# Patient Record
Sex: Female | Born: 1965 | Hispanic: Yes | Marital: Single | Smoking: Never smoker
Health system: Southern US, Community
[De-identification: ages and names within clinical notes are randomized; demographics above are authoritative.]

## PROBLEM LIST (undated history)

## (undated) DIAGNOSIS — F329 Major depressive disorder, single episode, unspecified: Secondary | ICD-10-CM

## (undated) DIAGNOSIS — D649 Anemia, unspecified: Secondary | ICD-10-CM

## (undated) DIAGNOSIS — K759 Inflammatory liver disease, unspecified: Secondary | ICD-10-CM

## (undated) DIAGNOSIS — D219 Benign neoplasm of connective and other soft tissue, unspecified: Secondary | ICD-10-CM

## (undated) DIAGNOSIS — F419 Anxiety disorder, unspecified: Secondary | ICD-10-CM

## (undated) DIAGNOSIS — M79643 Pain in unspecified hand: Secondary | ICD-10-CM

## (undated) DIAGNOSIS — N921 Excessive and frequent menstruation with irregular cycle: Secondary | ICD-10-CM

## (undated) DIAGNOSIS — E7800 Pure hypercholesterolemia, unspecified: Secondary | ICD-10-CM

## (undated) DIAGNOSIS — K219 Gastro-esophageal reflux disease without esophagitis: Secondary | ICD-10-CM

## (undated) DIAGNOSIS — M791 Myalgia, unspecified site: Secondary | ICD-10-CM

## (undated) DIAGNOSIS — F32A Depression, unspecified: Secondary | ICD-10-CM

## (undated) HISTORY — PX: ABDOMINAL HYSTERECTOMY: SHX81

## (undated) HISTORY — PX: CHOLECYSTECTOMY: SHX55

## (undated) HISTORY — DX: Excessive and frequent menstruation with irregular cycle: N92.1

## (undated) HISTORY — PX: DILATION AND CURETTAGE OF UTERUS: SHX78

## (undated) HISTORY — DX: Benign neoplasm of connective and other soft tissue, unspecified: D21.9

## (undated) HISTORY — PX: OTHER SURGICAL HISTORY: SHX169

---

## 2001-04-25 ENCOUNTER — Emergency Department (HOSPITAL_COMMUNITY): Admission: EM | Admit: 2001-04-25 | Discharge: 2001-04-25 | Payer: Self-pay | Admitting: Emergency Medicine

## 2005-10-23 ENCOUNTER — Ambulatory Visit: Payer: Self-pay | Admitting: Nurse Practitioner

## 2005-11-06 ENCOUNTER — Other Ambulatory Visit: Admission: RE | Admit: 2005-11-06 | Discharge: 2005-11-06 | Payer: Self-pay | Admitting: Family Medicine

## 2005-11-06 ENCOUNTER — Ambulatory Visit: Payer: Self-pay | Admitting: Nurse Practitioner

## 2005-11-06 ENCOUNTER — Encounter (INDEPENDENT_AMBULATORY_CARE_PROVIDER_SITE_OTHER): Payer: Self-pay | Admitting: *Deleted

## 2005-11-11 ENCOUNTER — Ambulatory Visit (HOSPITAL_COMMUNITY): Admission: RE | Admit: 2005-11-11 | Discharge: 2005-11-11 | Payer: Self-pay | Admitting: Family Medicine

## 2005-11-20 ENCOUNTER — Encounter: Admission: RE | Admit: 2005-11-20 | Discharge: 2005-11-20 | Payer: Self-pay | Admitting: Family Medicine

## 2006-01-14 ENCOUNTER — Ambulatory Visit: Payer: Self-pay | Admitting: Nurse Practitioner

## 2010-02-03 ENCOUNTER — Emergency Department (HOSPITAL_COMMUNITY): Admission: EM | Admit: 2010-02-03 | Discharge: 2010-02-03 | Payer: Self-pay | Admitting: Emergency Medicine

## 2010-07-03 LAB — URINALYSIS, ROUTINE W REFLEX MICROSCOPIC
Bilirubin Urine: NEGATIVE
Glucose, UA: NEGATIVE mg/dL
Leukocytes, UA: NEGATIVE
Nitrite: NEGATIVE
Specific Gravity, Urine: 1.018 (ref 1.005–1.030)
Urobilinogen, UA: 0.2 mg/dL (ref 0.0–1.0)

## 2010-07-03 LAB — POCT I-STAT, CHEM 8
BUN: 7 mg/dL (ref 6–23)
Creatinine, Ser: 0.7 mg/dL (ref 0.4–1.2)
Hemoglobin: 10.9 g/dL — ABNORMAL LOW (ref 12.0–15.0)
Potassium: 3.9 mEq/L (ref 3.5–5.1)
Sodium: 137 mEq/L (ref 135–145)

## 2010-07-03 LAB — WET PREP, GENITAL: Trich, Wet Prep: NONE SEEN

## 2010-07-03 LAB — GC/CHLAMYDIA PROBE AMP, GENITAL: Chlamydia, DNA Probe: NEGATIVE

## 2010-07-31 ENCOUNTER — Inpatient Hospital Stay (HOSPITAL_COMMUNITY)
Admission: AD | Admit: 2010-07-31 | Discharge: 2010-07-31 | Disposition: A | Discharge status: Home / Self Care | Payer: Self-pay | Source: Ambulatory Visit | Attending: Obstetrics and Gynecology | Admitting: Obstetrics and Gynecology

## 2010-07-31 DIAGNOSIS — D649 Anemia, unspecified: Secondary | ICD-10-CM

## 2010-07-31 LAB — CBC
HCT: 30.7 % — ABNORMAL LOW (ref 36.0–46.0)
Hemoglobin: 8.6 g/dL — ABNORMAL LOW (ref 12.0–15.0)
MCHC: 28 g/dL — ABNORMAL LOW (ref 30.0–36.0)
MCV: 67.9 fL — ABNORMAL LOW (ref 78.0–100.0)
Platelets: 470 10*3/uL — ABNORMAL HIGH (ref 150–400)
RBC: 4.52 MIL/uL (ref 3.87–5.11)
RDW: 21 % — ABNORMAL HIGH (ref 11.5–15.5)

## 2010-07-31 LAB — COMPREHENSIVE METABOLIC PANEL
ALT: 33 U/L (ref 0–35)
Albumin: 3.8 g/dL (ref 3.5–5.2)
Alkaline Phosphatase: 59 U/L (ref 39–117)
Creatinine, Ser: 0.55 mg/dL (ref 0.4–1.2)
GFR calc non Af Amer: >60 mL/min (ref >60)
Sodium: 134 mEq/L — ABNORMAL LOW (ref 135–145)
Total Protein: 7.6 g/dL (ref 6.0–8.3)

## 2010-07-31 LAB — URINALYSIS, ROUTINE W REFLEX MICROSCOPIC
Bilirubin Urine: NEGATIVE
Ketones, ur: NEGATIVE mg/dL
Nitrite: NEGATIVE
Protein, ur: NEGATIVE mg/dL
Specific Gravity, Urine: 1.025 (ref 1.005–1.030)
Urobilinogen, UA: 0.2 mg/dL (ref 0.0–1.0)

## 2010-08-02 LAB — URINE CULTURE

## 2010-08-30 ENCOUNTER — Other Ambulatory Visit: Payer: Self-pay | Admitting: Family Medicine

## 2010-08-30 ENCOUNTER — Encounter (INDEPENDENT_AMBULATORY_CARE_PROVIDER_SITE_OTHER): Payer: Self-pay | Admitting: Family Medicine

## 2010-08-30 ENCOUNTER — Other Ambulatory Visit (HOSPITAL_COMMUNITY)
Admission: RE | Admit: 2010-08-30 | Discharge: 2010-08-30 | Disposition: A | Discharge status: Home / Self Care | Payer: Self-pay | Source: Ambulatory Visit | Attending: Family Medicine | Admitting: Family Medicine

## 2010-08-30 DIAGNOSIS — N920 Excessive and frequent menstruation with regular cycle: Secondary | ICD-10-CM

## 2010-08-30 DIAGNOSIS — N949 Unspecified condition associated with female genital organs and menstrual cycle: Secondary | ICD-10-CM | POA: Insufficient documentation

## 2010-08-30 DIAGNOSIS — N841 Polyp of cervix uteri: Secondary | ICD-10-CM | POA: Insufficient documentation

## 2010-08-30 DIAGNOSIS — N72 Inflammatory disease of cervix uteri: Secondary | ICD-10-CM | POA: Insufficient documentation

## 2010-08-30 DIAGNOSIS — N938 Other specified abnormal uterine and vaginal bleeding: Secondary | ICD-10-CM | POA: Insufficient documentation

## 2010-08-31 NOTE — Group Therapy Note (Signed)
NAMEETHLEEN, LORMAND        ACCOUNT NO.:  [id number]  MEDICAL RECORD NO.:  [id number]           PATIENT TYPE:  A  LOCATION:  [facility]                   FACILITY:  [facility]  PHYSICIAN:  Tinnie Gens, MD        DATE OF BIRTH:  1966/03/10  DATE OF SERVICE:  08/30/2010                                 CLINIC NOTE  CHIEF COMPLAINT:  Heavy vaginal bleeding.  HISTORY OF PRESENT ILLNESS:  The patient is a 45 year old gravida 3, para 1-0-2-1 who comes in for heavy vaginal bleeding for the last one and half years.  She was previously seen in a general medical clinic and in the ED twice for heavy vaginal bleeding.  She has been somewhat anemic with a hemoglobin of 8.8 without tremendous symptoms.  However, she is put on Provera which she takes when she is not having a cycle and stops when she is having a cycle.  PAST MEDICAL HISTORY:  Significant for anemia.  PAST SURGICAL HISTORY:  Cholecystectomy and D and C x2.  MEDICATIONS: 1. Provera 5 mg daily. 2. Iron daily.  ALLERGIES:  None known.  OBSTETRICAL HISTORY:  Her G1 is her first pregnancy resulted in a vaginal delivery of a term female and then followed by 2 miscarriages.  GYNECOLOGIC HISTORY:  No history of abnormal Pap.  She is not really interested in future fertility.  FAMILY HISTORY:  No breast, colon, or ovarian cancer noted.  Mother has benign uterine tumor.  SOCIAL HISTORY:  No tobacco, alcohol or drug use.  Her review of systems are reviewed.  She denies fevers, chills, nausea, vomiting, diarrhea, constipation, abdominal pain, chest pain, shortness of breath, headache, swelling, vision changes.  On exam today, her vitals are as noted in the chart.  Her blood pressure is 120/84.  She is 59 inches tall, 140 pounds.  GU, normal external female genitalia.  BUS is normal.  Vagina is pink and rugated.  Cervix is parous with an endocervical polyp noted.  PROCEDURE:  The cervix is prepped with Betadine, was grasped  anteriorly with single-tooth tenaculum, sound with a Pipelle to approximately 10 cm.  Endometrial sampling is taken.  Focal endocervical polyp was removed with ring forceps and by twisting, removed bulk of the polyp, there was a little bit of stalk left and this was removed in a similar fashion.  There is a minimal amount of oozing after this procedure.  The uterus felt somewhat enlarged.  Adnexa were not well appreciated.  IMPRESSION:  Abnormal uterine bleeding with anemia.  PLAN: 1. Check TSH, CBC today. 2. Await endometrial biopsy and cervical polyp results. 3. We will scheduled for pelvic ultrasound. 4. Follow up in 2 weeks for results.          ______________________________ Tinnie Gens, MD    TP/MEDQ  D:  08/30/2010  T:  08/30/2010  Job:  [id number]

## 2010-09-03 ENCOUNTER — Ambulatory Visit (HOSPITAL_COMMUNITY)
Admission: RE | Admit: 2010-09-03 | Discharge: 2010-09-03 | Disposition: A | Discharge status: Home / Self Care | Payer: Self-pay | Source: Ambulatory Visit | Attending: Family Medicine | Admitting: Family Medicine

## 2010-09-03 DIAGNOSIS — N920 Excessive and frequent menstruation with regular cycle: Secondary | ICD-10-CM | POA: Insufficient documentation

## 2010-09-03 DIAGNOSIS — N831 Corpus luteum cyst of ovary, unspecified side: Secondary | ICD-10-CM | POA: Insufficient documentation

## 2010-09-03 DIAGNOSIS — N83209 Unspecified ovarian cyst, unspecified side: Secondary | ICD-10-CM | POA: Insufficient documentation

## 2010-09-03 DIAGNOSIS — D250 Submucous leiomyoma of uterus: Secondary | ICD-10-CM | POA: Insufficient documentation

## 2010-09-03 DIAGNOSIS — D251 Intramural leiomyoma of uterus: Secondary | ICD-10-CM | POA: Insufficient documentation

## 2010-09-23 ENCOUNTER — Ambulatory Visit (INDEPENDENT_AMBULATORY_CARE_PROVIDER_SITE_OTHER): Payer: Self-pay | Admitting: Obstetrics & Gynecology

## 2010-09-23 DIAGNOSIS — N920 Excessive and frequent menstruation with regular cycle: Secondary | ICD-10-CM

## 2010-09-24 NOTE — Group Therapy Note (Signed)
NAME:  MARVIS, BAKKEN        ACCOUNT NO.:  [id number]  MEDICAL RECORD NO.:  [id number]           PATIENT TYPE:  A  LOCATION:  [facility]                   FACILITY:  [facility]  PHYSICIAN:  Elsie Lincoln, MD      DATE OF BIRTH:  05/10/1965  DATE OF SERVICE:  09/23/2010                                 CLINIC NOTE  The patient is a 45 year old female who presents for followup of menorrhagia.  The patient had ultrasound that showed multiple fibroids to the submucosal component.  The extent of the submucosal component could not be determined without saline sonohysterogram.  The patient also had endometrial biopsy which was negative and she had a cervical polyp removed which is negative for malignancy.  The patient has been on Provera continuously only 5 mg a day.  This was given to her by emergency room.  The patient believes only once a month but unpredictably, however, hemoglobin is increased from 8.8-12.4.  Given she had a good response to medication, I think she should try combined oral contraceptives, the patient has no contraindications to starting. She is a nonsmoker.  She has no medical problems and she has no history of clots in her legs or lungs or any immediate relatives.  The patient was told the risks of birth control pills as clot in the legs or lungs.  PAST MEDICAL HISTORY:  Anemia.  PAST SURGICAL HISTORY:  D and C x2 and cholecystectomy.  MEDICATIONS: 1. Provera 5 mg daily. 2. Iron p.o. daily.  ALLERGIES:  None.  PHYSICAL EXAMINATION:  VITAL SIGNS:  Temperature 98.6, pulse 79, blood pressure 115/80, and height 59 inches. GENERAL:  Well nourished, well developed, in no apparent distress. HEENT:  Normocephalic and atraumatic. CHEST:  Normal movement. NEURO:  Intact and ambulates well.  ASSESSMENT/PLAN:  A 45 year old female for initiation of therapy to treat her menorrhagia. 1. We will start Ortho-Cyclen 1 tablet p.o. daily.  The patient was     told to wait 3  months before changing methods or progressing to     surgery as the first 3 months can cause irregular bleeding.     However, if she is bleeding heavily, she should come sooner to our     office. 2. Information given for mammogram scholarship. 3. The patient states her Pap smear is up to date at [facility]. 4. Return to clinic in 6 months.          ______________________________ Elsie Lincoln, MD    KL/MEDQ  D:  09/23/2010  T:  09/24/2010  Job:  [id number]

## 2011-06-10 ENCOUNTER — Encounter (HOSPITAL_COMMUNITY): Payer: Self-pay | Admitting: *Deleted

## 2011-06-10 ENCOUNTER — Inpatient Hospital Stay (HOSPITAL_COMMUNITY): Payer: Self-pay

## 2011-06-10 ENCOUNTER — Inpatient Hospital Stay (HOSPITAL_COMMUNITY)
Admission: AD | Admit: 2011-06-10 | Discharge: 2011-06-11 | Disposition: A | Discharge status: Home / Self Care | Payer: Self-pay | Source: Ambulatory Visit | Attending: Obstetrics & Gynecology | Admitting: Obstetrics & Gynecology

## 2011-06-10 DIAGNOSIS — B9689 Other specified bacterial agents as the cause of diseases classified elsewhere: Secondary | ICD-10-CM | POA: Insufficient documentation

## 2011-06-10 DIAGNOSIS — D649 Anemia, unspecified: Secondary | ICD-10-CM | POA: Insufficient documentation

## 2011-06-10 DIAGNOSIS — A499 Bacterial infection, unspecified: Secondary | ICD-10-CM | POA: Insufficient documentation

## 2011-06-10 DIAGNOSIS — N938 Other specified abnormal uterine and vaginal bleeding: Secondary | ICD-10-CM | POA: Insufficient documentation

## 2011-06-10 DIAGNOSIS — N760 Acute vaginitis: Secondary | ICD-10-CM | POA: Insufficient documentation

## 2011-06-10 DIAGNOSIS — D259 Leiomyoma of uterus, unspecified: Secondary | ICD-10-CM | POA: Insufficient documentation

## 2011-06-10 DIAGNOSIS — N921 Excessive and frequent menstruation with irregular cycle: Secondary | ICD-10-CM

## 2011-06-10 DIAGNOSIS — N920 Excessive and frequent menstruation with regular cycle: Secondary | ICD-10-CM

## 2011-06-10 DIAGNOSIS — N390 Urinary tract infection, site not specified: Secondary | ICD-10-CM | POA: Insufficient documentation

## 2011-06-10 DIAGNOSIS — N949 Unspecified condition associated with female genital organs and menstrual cycle: Secondary | ICD-10-CM | POA: Insufficient documentation

## 2011-06-10 HISTORY — DX: Anemia, unspecified: D64.9

## 2011-06-10 LAB — CBC
Hemoglobin: 5.5 g/dL — CL (ref 12.0–15.0)
MCV: 66 fL — ABNORMAL LOW (ref 78.0–100.0)
RBC: 3.12 MIL/uL — ABNORMAL LOW (ref 3.87–5.11)
RDW: 21.3 % — ABNORMAL HIGH (ref 11.5–15.5)
WBC: 9 10*3/uL (ref 4.0–10.5)

## 2011-06-10 LAB — URINALYSIS, ROUTINE W REFLEX MICROSCOPIC
Bilirubin Urine: NEGATIVE
Glucose, UA: NEGATIVE mg/dL
Ketones, ur: NEGATIVE mg/dL
Nitrite: POSITIVE — AB
Protein, ur: NEGATIVE mg/dL
Specific Gravity, Urine: <1.005 — ABNORMAL LOW (ref 1.005–1.030)
Urobilinogen, UA: 0.2 mg/dL (ref 0.0–1.0)
pH: 5 (ref 5.0–8.0)

## 2011-06-10 LAB — URINE MICROSCOPIC-ADD ON

## 2011-06-10 LAB — WET PREP, GENITAL
WBC, Wet Prep HPF POC: NONE SEEN
Yeast Wet Prep HPF POC: NONE SEEN

## 2011-06-10 MED ORDER — ACETAMINOPHEN 500 MG PO TABS: 1000.0000 mg | ORAL_TABLET | Freq: Once | ORAL | Status: DC

## 2011-06-10 MED ORDER — IBUPROFEN 600 MG PO TABS
600.0000 mg | ORAL_TABLET | Freq: Once | ORAL | Status: AC
  Administered 2011-06-10: 600 mg via ORAL
  Filled 2011-06-10: qty 1

## 2011-06-10 NOTE — Progress Notes (Signed)
Pt reports per interpreter she has been bleeding x 2 months, has a headache and feels weak. At times changing a pad q 30 minutes. Pain in lower abd x 2 months.

## 2011-06-10 NOTE — ED Provider Notes (Signed)
History     Chief Complaint  Patient presents with  . Vaginal Bleeding   HPI 46 y.o. [id number] with vaginal bleeding x 2 months. Has been feeling dizzy, felt short of breath and like she was going to pass out at work today, feeling fine now. States periods are heavy and last about 15 days, then spotting in between. H/O fibroids and DUB, evaluated in GYN clinic last year, had u/s showing fibroids and normal EMB. Anemic at that time, was taking iron supplement, but is no longer taking.    Past Medical History  Diagnosis Date  . Anemia     Past Surgical History  Procedure Date  . Cholecystectomy     History reviewed. No pertinent family history.  History  Substance Use Topics  . Smoking status: Never Smoker   . Smokeless tobacco: Not on file  . Alcohol Use: Yes    Allergies: No Known Allergies  No prescriptions prior to admission    Review of Systems  Constitutional: Negative.   Respiratory: Negative.   Cardiovascular: Negative.   Gastrointestinal: Negative for nausea, vomiting, abdominal pain, diarrhea and constipation.  Genitourinary: Negative for dysuria, urgency, frequency, hematuria and flank pain.       Positive for vaginal bleeding  Musculoskeletal: Negative.   Neurological: Positive for dizziness and headaches. Negative for loss of consciousness.  Psychiatric/Behavioral: Negative.    Physical Exam   Blood pressure 125/52, pulse 80, temperature 98.1 F (36.7 C), resp. rate 18, height 4\' 11"  (1.499 m), weight 131 lb (59.421 kg), last menstrual period 04/13/2011, SpO2 100.00%.  Physical Exam  Nursing note and vitals reviewed. Constitutional: She is oriented to person, place, and time. She appears well-developed and well-nourished. No distress.  Cardiovascular: Normal rate and regular rhythm.   Respiratory: Effort normal. No respiratory distress.  GI: Soft.  Genitourinary: Uterus is enlarged. Uterus is not tender. Right adnexum displays tenderness and fullness.  Right adnexum displays no mass. Left adnexum displays no tenderness and no fullness. There is bleeding (small) around the vagina.  Musculoskeletal: Normal range of motion.  Neurological: She is alert and oriented to person, place, and time.  Skin: Skin is warm and dry.  Psychiatric: She has a normal mood and affect.    MAU Course  Procedures  Results for orders placed during the hospital encounter of 06/10/11 (from the past 24 hour(s))  URINALYSIS, ROUTINE W REFLEX MICROSCOPIC     Status: Abnormal   Collection Time   06/10/11  9:40 PM      Component Value Range   Color, Urine YELLOW  YELLOW    APPearance CLEAR  CLEAR    Specific Gravity, Urine <1.005 (*) 1.005 - 1.030    pH 5.0  5.0 - 8.0    Glucose, UA NEGATIVE  NEGATIVE (mg/dL)   Hgb urine dipstick TRACE (*) NEGATIVE    Bilirubin Urine NEGATIVE  NEGATIVE    Ketones, ur NEGATIVE  NEGATIVE (mg/dL)   Protein, ur NEGATIVE  NEGATIVE (mg/dL)   Urobilinogen, UA 0.2  0.0 - 1.0 (mg/dL)   Nitrite POSITIVE (*) NEGATIVE    Leukocytes, UA NEGATIVE  NEGATIVE   URINE MICROSCOPIC-ADD ON     Status: Normal   Collection Time   06/10/11  9:40 PM      Component Value Range   Squamous Epithelial / LPF RARE  RARE    RBC / HPF 0-2  <3 (RBC/hpf)   Bacteria, UA RARE  RARE   CBC     Status: Abnormal  Collection Time   06/10/11 10:03 PM      Component Value Range   WBC 9.0  4.0 - 10.5 (K/uL)   RBC 3.12 (*) 3.87 - 5.11 (MIL/uL)   Hemoglobin 5.5 (*) 12.0 - 15.0 (g/dL)   HCT 86.5 (*) 78.4 - 46.0 (%)   MCV 66.0 (*) 78.0 - 100.0 (fL)   MCH 17.6 (*) 26.0 - 34.0 (pg)   MCHC 26.7 (*) 30.0 - 36.0 (g/dL)   RDW 69.6 (*) 29.5 - 15.5 (%)   Platelets 430 (*) 150 - 400 (K/uL)  POCT PREGNANCY, URINE     Status: Normal   Collection Time   06/10/11 10:08 PM      Component Value Range   Preg Test, Ur NEGATIVE  NEGATIVE   WET PREP, GENITAL     Status: Abnormal   Collection Time   06/10/11 10:30 PM      Component Value Range   Yeast Wet Prep HPF POC NONE SEEN   NONE SEEN    Trich, Wet Prep NONE SEEN  NONE SEEN    Clue Cells Wet Prep HPF POC FEW (*) NONE SEEN    WBC, Wet Prep HPF POC NONE SEEN  NONE SEEN    US Transvaginal Non-ob  06/11/2011  *RADIOLOGY REPORT*  Clinical Data: Vaginal bleeding and pelvic pain for the past 2 months.  Right adnexal tenderness.  Known uterine fibroids.  TRANSABDOMINAL AND TRANSVAGINAL ULTRASOUND OF PELVIS Technique:  Both transabdominal and transvaginal ultrasound examinations of the pelvis were performed. Transabdominal technique was performed for global imaging of the pelvis including uterus, ovaries, adnexal regions, and pelvic cul-de-sac.  Comparison: 09/03/2010.   It was necessary to proceed with endovaginal exam following the transabdominal exam to visualize the uterus and ovaries in better detail.  Findings:  Uterus: Enlarged and containing multiple masses.  The uterus measures 12.8 x 7.4 x 7.1 cm.  Two dominant masses are again demonstrated.  One measures 3.8 x 3.9 x 3.7 cm and the other measures 3.8 x 3.6 x 3.4 cm.  These were previously shown to have submucosal components.  Endometrium: Not visualized due to the uterine masses obscuring the endometrium.  Right ovary:  Normal appearance/no adnexal mass  Left ovary: Normal appearance/no adnexal mass  Other findings: No free fluid  IMPRESSION: Little change in previously demonstrated uterine fibroids.  These are obscuring the endometrium.  No acute abnormality.  Original Report Authenticated By: Darrol Angel, M.D.   US Pelvis Complete  06/11/2011  *RADIOLOGY REPORT*  Clinical Data: Vaginal bleeding and pelvic pain for the past 2 months.  Right adnexal tenderness.  Known uterine fibroids.  TRANSABDOMINAL AND TRANSVAGINAL ULTRASOUND OF PELVIS Technique:  Both transabdominal and transvaginal ultrasound examinations of the pelvis were performed. Transabdominal technique was performed for global imaging of the pelvis including uterus, ovaries, adnexal regions, and pelvic  cul-de-sac.  Comparison: 09/03/2010.   It was necessary to proceed with endovaginal exam following the transabdominal exam to visualize the uterus and ovaries in better detail.  Findings:  Uterus: Enlarged and containing multiple masses.  The uterus measures 12.8 x 7.4 x 7.1 cm.  Two dominant masses are again demonstrated.  One measures 3.8 x 3.9 x 3.7 cm and the other measures 3.8 x 3.6 x 3.4 cm.  These were previously shown to have submucosal components.  Endometrium: Not visualized due to the uterine masses obscuring the endometrium.  Right ovary:  Normal appearance/no adnexal mass  Left ovary: Normal appearance/no adnexal mass  Other findings:  No free fluid  IMPRESSION: Little change in previously demonstrated uterine fibroids.  These are obscuring the endometrium.  No acute abnormality.  Original Report Authenticated By: Darrol Angel, M.D.    Assessment and Plan  46 y.o. [id number]  BV - rx Flagyl UTI - rx Cipro Fibroids, DUB and anemia - pt stable at this time, bleeding light, discussed blood transfusion vs. D/c home with Provera and Iron supplement, pt opts for no blood transfusion at this time, precautions rev'd F/U in GYN clinic   [facility] 06/11/2011, 5:15 AM

## 2011-06-10 NOTE — Progress Notes (Signed)
SSE per CNM.  Wet prep and cultures collected.  VE done.  

## 2011-06-10 NOTE — Progress Notes (Signed)
N. Frazier, CNM at bedside.  Assessment done and poc discussed with pt.  

## 2011-06-10 NOTE — ED Notes (Signed)
Georges Mouse CNM notified of critical Hgb of 5.5.

## 2011-06-11 ENCOUNTER — Encounter: Payer: Self-pay | Admitting: Obstetrics & Gynecology

## 2011-06-11 MED ORDER — FERROUS SULFATE 325 (65 FE) MG PO TABS: 325.0000 mg | ORAL_TABLET | Freq: Three times a day (TID) | ORAL | Status: DC

## 2011-06-11 MED ORDER — METRONIDAZOLE 500 MG PO TABS: 500.0000 mg | ORAL_TABLET | Freq: Two times a day (BID) | ORAL | Status: AC

## 2011-06-11 MED ORDER — CIPROFLOXACIN HCL 250 MG PO TABS: 250.0000 mg | ORAL_TABLET | Freq: Two times a day (BID) | ORAL | Status: AC

## 2011-06-11 MED ORDER — MEDROXYPROGESTERONE ACETATE 10 MG PO TABS: 10.0000 mg | ORAL_TABLET | Freq: Every day | ORAL | Status: DC

## 2011-06-23 ENCOUNTER — Inpatient Hospital Stay (HOSPITAL_COMMUNITY)
Admission: AD | Admit: 2011-06-23 | Discharge: 2011-06-23 | Disposition: A | Discharge status: Home / Self Care | Payer: Self-pay | Source: Ambulatory Visit | Attending: Obstetrics and Gynecology | Admitting: Obstetrics and Gynecology

## 2011-06-23 ENCOUNTER — Encounter (HOSPITAL_COMMUNITY): Payer: Self-pay | Admitting: *Deleted

## 2011-06-23 DIAGNOSIS — D259 Leiomyoma of uterus, unspecified: Secondary | ICD-10-CM | POA: Insufficient documentation

## 2011-06-23 DIAGNOSIS — N926 Irregular menstruation, unspecified: Secondary | ICD-10-CM

## 2011-06-23 DIAGNOSIS — N949 Unspecified condition associated with female genital organs and menstrual cycle: Secondary | ICD-10-CM | POA: Insufficient documentation

## 2011-06-23 DIAGNOSIS — N938 Other specified abnormal uterine and vaginal bleeding: Secondary | ICD-10-CM | POA: Insufficient documentation

## 2011-06-23 DIAGNOSIS — R109 Unspecified abdominal pain: Secondary | ICD-10-CM | POA: Insufficient documentation

## 2011-06-23 LAB — CBC
HCT: 33.8 % — ABNORMAL LOW (ref 36.0–46.0)
Hemoglobin: 9.6 g/dL — ABNORMAL LOW (ref 12.0–15.0)
RBC: 4.26 MIL/uL (ref 3.87–5.11)
WBC: 5.2 10*3/uL (ref 4.0–10.5)

## 2011-06-23 MED ORDER — HYDROMORPHONE HCL PF 1 MG/ML IJ SOLN
1.0000 mg | Freq: Once | INTRAMUSCULAR | Status: AC
  Administered 2011-06-23: 1 mg via INTRAMUSCULAR
  Filled 2011-06-23: qty 1

## 2011-06-23 MED ORDER — MEDROXYPROGESTERONE ACETATE 10 MG PO TABS: 20.0000 mg | ORAL_TABLET | Freq: Every day | ORAL | Status: DC

## 2011-06-23 MED ORDER — OXYCODONE-ACETAMINOPHEN 5-325 MG PO TABS: 1.0000 | ORAL_TABLET | ORAL | Status: AC | PRN

## 2011-06-23 NOTE — Progress Notes (Signed)
Pain started yesterday morning, had gone away, but came back worse this morning.  Reports bleeding started on Fri, not time of period, was recently here for bleeding- given provera.

## 2011-06-23 NOTE — Progress Notes (Signed)
[facility] Department of Clinical Social Work Documentation of Interpretation   I assisted ____Faculty Practice_______________ with interpretation of ____plan of care__________________ for this patient. 

## 2011-06-23 NOTE — ED Provider Notes (Signed)
History     Chief Complaint  Patient presents with  . Abdominal Pain   HPI 46 y.o. [id number] with heavy vaginal bleeding this morning and severe low abd pain. Seen 2 weeks ago in MAU for heavy bleeding and pain, fibroids on u/s, Hgb 5.5 at that time, asymptomatic and VS stable then, sent home with Provera 10 mg qday and Iron, has GYN appt on 3/22. After starting Provera, bleeding stopped x 1 week, then has been intermittent over the past week.    Past Medical History  Diagnosis Date  . Anemia     Past Surgical History  Procedure Date  . Cholecystectomy   . Dilation and curettage of uterus     Family History  Problem Relation Age of Onset  . Anesthesia problems Neg Hx     History  Substance Use Topics  . Smoking status: Never Smoker   . Smokeless tobacco: Never Used  . Alcohol Use: No    Allergies: No Known Allergies  Prescriptions prior to admission  Medication Sig Dispense Refill  . ferrous sulfate 325 (65 FE) MG tablet Take 325 mg by mouth 3 (three) times daily.      Marland Kitchen ibuprofen (ADVIL,MOTRIN) 200 MG tablet Take 800 mg by mouth every 6 (six) hours as needed. For pain      . Multiple Vitamins-Calcium (ONE-A-DAY WOMENS PO) Take 1 tablet by mouth daily.      Marland Kitchen DISCONTD: ferrous sulfate 325 (65 FE) MG tablet Take 1 tablet (325 mg total) by mouth 3 (three) times daily with meals.  90 tablet  11  . DISCONTD: medroxyPROGESTERone (PROVERA) 10 MG tablet Take 1 tablet (10 mg total) by mouth daily.  30 tablet  1    Review of Systems  Constitutional: Negative.   Respiratory: Positive for shortness of breath. Hemoptysis: with heavy activity.   Cardiovascular: Positive for palpitations (with heavy activity).  Gastrointestinal: Negative for nausea, vomiting, abdominal pain, diarrhea and constipation.  Genitourinary: Negative for dysuria, urgency, frequency, hematuria and flank pain.       Negative for vaginal bleeding, vaginal discharge, dyspareunia  Musculoskeletal: Negative.     Neurological: Positive for dizziness (with heavy activity).  Psychiatric/Behavioral: Negative.    Physical Exam   Blood pressure 113/68, pulse 63, temperature 97.3 F (36.3 C), temperature source Oral, resp. rate 18, height 4' 11.5" (1.511 m), weight 128 lb (58.06 kg), last menstrual period 04/13/2011, SpO2 100.00%.  Physical Exam  Nursing note and vitals reviewed. Constitutional: She is oriented to person, place, and time. She appears well-developed and well-nourished. No distress.  HENT:  Head: Normocephalic and atraumatic.  Cardiovascular: Normal rate and normal heart sounds.   Respiratory: Effort normal. No respiratory distress.  GI: Soft. She exhibits no distension and no mass. There is no tenderness. There is no rebound and no guarding.  Genitourinary: There is no rash or lesion on the right labia. There is no rash or lesion on the left labia. Uterus is not deviated, not enlarged, not fixed and not tender. Cervix exhibits no motion tenderness, no discharge and no friability. Right adnexum displays no mass, no tenderness and no fullness. Left adnexum displays no mass, no tenderness and no fullness. There is bleeding (large amount of blood expelled from vagina upon exam, about 250 ml of clots and blood evacuated, afterwards, bleeding slowed, no continuous heavy bleeding) around the vagina. No erythema or tenderness around the vagina. No vaginal discharge found.  Neurological: She is alert and oriented to person, place, and  time.  Skin: Skin is warm and dry.  Psychiatric: She has a normal mood and affect.    MAU Course  Procedures  Results for orders placed during the hospital encounter of 06/23/11 (from the past 24 hour(s))  CBC     Status: Abnormal   Collection Time   06/23/11  9:39 AM      Component Value Range   WBC 5.2  4.0 - 10.5 (K/uL)   RBC 4.26  3.87 - 5.11 (MIL/uL)   Hemoglobin 9.6 (*) 12.0 - 15.0 (g/dL)   HCT 56.2 (*) 13.0 - 46.0 (%)   MCV 79.3  78.0 - 100.0 (fL)    MCH 22.5 (*) 26.0 - 34.0 (pg)   MCHC 28.4 (*) 30.0 - 36.0 (g/dL)   RDW NOT CALCULATED  11.5 - 15.5 (%)   Platelets 294  150 - 400 (K/uL)   Pain relieved with Dilaudid 1 mg IM  Assessment and Plan   46 y.o. [id number] with DUB, fibroids Increase Provera to 20 mg qday Continue iron and motrin Rx Percocet 5/325 #30  F/U as scheduled, precautions rev'd  Esequiel Kleinfelter 06/23/2011, 10:53 AM

## 2011-06-23 NOTE — ED Provider Notes (Signed)
Agree with above note.  Diana Lewis 06/23/2011 3:14 PM

## 2011-07-11 ENCOUNTER — Ambulatory Visit (INDEPENDENT_AMBULATORY_CARE_PROVIDER_SITE_OTHER): Payer: Self-pay | Admitting: Obstetrics & Gynecology

## 2011-07-11 ENCOUNTER — Encounter: Payer: Self-pay | Admitting: Obstetrics & Gynecology

## 2011-07-11 ENCOUNTER — Other Ambulatory Visit (HOSPITAL_COMMUNITY)
Admission: RE | Admit: 2011-07-11 | Discharge: 2011-07-11 | Disposition: A | Discharge status: Home / Self Care | Payer: Self-pay | Source: Ambulatory Visit | Attending: Obstetrics & Gynecology | Admitting: Obstetrics & Gynecology

## 2011-07-11 VITALS — BP 121/74 | HR 63 | Temp 97.3°F | Ht 59.0 in | Wt 131.0 lb

## 2011-07-11 DIAGNOSIS — N920 Excessive and frequent menstruation with regular cycle: Secondary | ICD-10-CM

## 2011-07-11 DIAGNOSIS — N921 Excessive and frequent menstruation with irregular cycle: Secondary | ICD-10-CM

## 2011-07-11 DIAGNOSIS — Z01812 Encounter for preprocedural laboratory examination: Secondary | ICD-10-CM

## 2011-07-11 DIAGNOSIS — D259 Leiomyoma of uterus, unspecified: Secondary | ICD-10-CM

## 2011-07-11 DIAGNOSIS — D219 Benign neoplasm of connective and other soft tissue, unspecified: Secondary | ICD-10-CM | POA: Insufficient documentation

## 2011-07-11 MED ORDER — MEGESTROL ACETATE 20 MG PO TABS: ORAL_TABLET | ORAL | Status: DC

## 2011-07-11 NOTE — Patient Instructions (Signed)
Hemorragia uterina disfuncional (Dysfunctional Uterine Bleeding) Normalmente, los perodos menstruales comienzan en las jvenes de entre 11 y 17 aos. Un ciclo o perodo menstrual puede repetirse entre los 23 das y los 35 das y dura entre 1 y 7 das. Entre los 12 y los 14 das antes de que comience el ciclo menstrual, se produce la ovulacin (los ovarios producen vulos). Cuando cuente los periodos, hgalo desde el primer da del comienzo de la hemorragia del perodo anterior hasta el primer da de la hemorragia del perodo siguiente. La hemorragia uterina disfuncional (anormal) es diferente del perodo menstrual normal. Los perodos pueden comenzar antes o despus que lo habitual. Pueden ser menos abundantes, presentar cogulos, o ser ms abundantes. Puede tener hemorragias entre los perodos o saltear un perodo o ms. Puede tener hemorragia luego de mantener relaciones sexuales, despus de la menopausia o faltarle el perodo menstrual. CAUSAS  Embarazo (normal, aborto espontneo, embarazo ectpico).   DIU (dispositivo intrauterino, anticonceptivo)   Pldoras anticonceptivas   Tratamiento hormonal   La menopausia   Infeccin en el cervix.   Problemas de coagulacin.   Infecciones de la superficie interna del tero.   Endometriosis: la superficie interna del tero se desarrolla en la pelvis y otros rganos femeninos.   Adherencias (tejido cicatrizal) dentro del tero.   Obesidad o severa prdida de peso.   Plipos en el tero.   Cncer en el crvix, la vagina o el tero.   Quiste de ovarios o Sndrome ovrico poliqustico.   Otras enfermedades (diabetes, enfermedad de la tiroides, etc).   Fibromas en el tero (tumores no cancerosos).   Problemas con las hormonas femeninas.   Hiperplasia del endometrio: capa gruesa y clulas agrandadas dentro del tero.   Medicamentos que interfieren con la ovulacin.   Radiacin en la pelvis o el abdomen.   Quimioterapia.    DIAGNSTICO  El mdico analizar la historia de sus perodos menstruales, los medicamentos que toma, los cambios en el peso corporal, el estrs de la vida diaria y cualquier problema mdico que tenga.   El mdico realizar un examen fsico, incluyendo un examen plvico.   Tambin le solicitar anlisis para completar el diagnstico. Ellos son:   Papanicolau.   Anlisis de sangre.   Cultivos para descartar infecciones.   Tomografa computada.   Ultrasonido.   Histeroscopa.   Laparoscopa.   Resonancia magntica.   Histerosalpingografa.   Dilatacin y curetaje.   Biopsia de endometrio.  TRATAMIENTO El tratamiento depender de la causa de la hemorragia.. El tratamiento consiste en:  Observacin de los perodos menstruales durante algunos meses.   Prescripcin de medicamentos como:   Antibiticos:   Hormonas.   Pldoras anticonceptivas   Retirar el DIU (dispositivo intrauterino, anticonceptivo).   Ciruga:   Dilatacin y curetaje (raspado y remocin de tejido de la zona interna del tero).   Laparoscopa (examen del interior del abdomen con un tubo con luz).   Ablacin uterina (destruccin de la membrana que cubre el tero con corriente elctrica, rayos lser, congelamiento o calor ).   Histeroscopa (examen del cuello y el tero con un tubo con luz).   Histerectoma (extirpacin del tero).  INSTRUCCIONES PARA EL CUIDADO DOMICILIARIO  Si el profesional que la asiste le prescribe medicamentos, tmelos tal como se le indic. No cambie ni reemplace medicamentos sin consultarlo con el profesional.   Las hemorragias de larga duracin pueden traen como consecuencia un dficit de hierro. El profesional que lo asiste podr prescribirle hierro. Esto ayuda a reponer el   hierro que el organismo pierde luego de una hemorragia abundante. Tome los medicamentos tal como se le indic.   No tome aspirina o medicamentos que la contengan u desde una semana antes del perodo  menstrual ni durante el mismo. La aspirina puede hacer que la hemorragia empeore.   Si necesita cambiar el apsito o el tampn mas de una vez cada 2 horas, permanezca en cama con los pies elevados y aplique una compresa fra en la zona baja del abdomen. Descanse todo lo que pueda hasta que la hemorragia se detenga.   Consuma alimentos balanceados. Coma alimentos ricos en hierro. Por ejemplo:   Vegetales verdes frescos.   Cereales integrales y cereales y panes con salvado.   Huevos.   Carnes.   Hgado.   No trate de perder peso hasta que la hemorragia anormal se detenga y los niveles de hierro en la sangre vuelvan a la normalidad. No levante pesos de ms de 10 libras (5 kg.) ni realice actividades extenuantes mientras tenga la hemorragia.   Durante un par de meses tome nota en un almanaque y marque el comienzo y el fin de su perodo menstrual y el tipo de sangrado (escaso, medio, abundante, gotas, cogulos o falta de perodo). El objetivo es que su mdico evale mejor el problema.  SOLICITE ATENCIN MDICA SI:  Debe cambiar el apsito o el tampn ms de una vez cada hora.   Se siente mareada o dbil.   Tiene algn problema que pueda relacionarse con el medicamento que est tomando.   Siente dolor.   Desea retirar su DIU.   Quiere suspender o cambiar sus pldoras anticonceptivas u hormonas.   Tiene algn tipo de hemorragia anormal mencionada anteriormente.   Tiene ms de 16 aos y an no ha tenido su perodo menstrual.   Tiene 55 aos y an tiene perodos menstruales.   Tiene alguno de los sntomas mencionados anteriormente.   Aparece una erupcin cutnea.  SOLICITE ATENCIN MDICA DE INMEDIATO SI:  La temperatura oral se eleva sin motivo por encima de 102 F (38.9 C).   Comienza a sentir escalofros.   Debe cambiar el apsito o el tampn ms de una vez cada hora.   Siente dolor abdominal.   Pierde el conocimiento, se desmaya.  Document Released: 01/15/2005  Document Revised: 03/27/2011 [employer] Patient Information 2012 [employer]. 

## 2011-07-11 NOTE — Progress Notes (Signed)
History:  46 y.o. [id number] here today for evaluation of menometrorrhagia and fibroids since 2011.  She was evaluated in MAU in 05/2008 and had a Hgb of 5.5, she declined transfusion and was sent home on Provera.  She returned ito MAu in 3/12 with heavy bleeding and her hemoglobin was noted to be 9.6; she was sent home with increased dosage of Provera and told to follow up here in clinic.  Today, she reports continued bleeding on Provera 20mg  daily, but a little lighter than before. Denies any dizziness, lightheadedness or other anemia symptoms. Patient is Spanish-speaking only, interpreter present for this encounter.  3/20/13TRANSABDOMINAL AND TRANSVAGINAL ULTRASOUND OF PELVIS  Findings: Uterus: Enlarged and containing multiple masses. The uterus measures 12.8 x 7.4 x 7.1 cm. Two dominant masses are again demonstrated. One measures 3.8 x 3.9 x 3.7 cm and the other measures 3.8 x 3.6 x 3.4 cm. These were previously shown to have submucosal components. Endometrium: Not visualized due to the uterine masses obscuring the endometrium. Right ovary: Normal appearance/no adnexal mass Left ovary: Normal appearance/no adnexal mass Other findings: No free fluid IMPRESSION: Little change in previously demonstrated uterine fibroids. These are obscuring the endometrium. No acute abnormality.   The following portions of the patient's history were reviewed and updated as appropriate: allergies, current medications, past family history, past medical history, past social history, past surgical history and problem list.   Objective:  Physical Exam Blood pressure 121/74, pulse 63, temperature 97.3 F (36.3 C), temperature source Oral, height 4\' 11"  (1.499 m), weight 131 lb (59.421 kg), last menstrual period 06/06/2011. Gen: NAD Abd: Soft, nontender and nondistended Pelvic: Normal appearing external genitalia; normal appearing vaginal mucosa and cervix. Small amount of red blood present in vagina..  Enlarged uterus, no  palpable masses or adnexal tenderness.  ENDOMETRIAL BIOPSY     The indications for endometrial biopsy were reviewed.   Risks of the biopsy including cramping, bleeding, infection, uterine perforation, inadequate specimen and need for additional procedures  were discussed. The patient states she understands and agrees to undergo procedure today. Consent was signed. Time out was performed. Urine HCG was negative. A sterile speculum was placed in the patient's vagina and the cervix was prepped with Betadine. A single-toothed tenaculum was placed on the anterior lip of the cervix to stabilize it. The 3 mm pipelle was introduced into the endometrial cavity without difficulty to a depth of 12 cm, and a moderate amount of tissue was obtained and sent to pathology. The instruments were removed from the patient's vagina. Minimal bleeding from the cervix was noted. The patient tolerated the procedure well. Routine post-procedure instructions were given to the patient.   Assessment & Plan:  Menometrorrhagia, fibroids with submucosal components, anemia. Will follow up endometrial biopsy, then patient will follow up in 2 weeks to review the results and to discuss further management.  She is unsure of what she wants to do for know.  In the meantime, Megace taper then maintenance therapy e-prescribed for patient; some patients respond better to Megace than Provera so it is worth the attempt.  Bleeding and anemia precautions reviewed.  Financial aid application and mammogram scholarship application given to patient, she may also qualify for [facility] clinic for pap smear and breast evaluation.

## 2011-07-11 NOTE — Progress Notes (Signed)
MAU referral for c/o heavy periods x 3 months, states has bled since beginning of January except for one week in February.

## 2011-07-13 ENCOUNTER — Encounter: Payer: Self-pay | Admitting: Obstetrics & Gynecology

## 2011-07-30 ENCOUNTER — Other Ambulatory Visit: Payer: Self-pay | Admitting: Obstetrics and Gynecology

## 2011-07-30 DIAGNOSIS — Z1231 Encounter for screening mammogram for malignant neoplasm of breast: Secondary | ICD-10-CM

## 2011-08-05 ENCOUNTER — Ambulatory Visit (INDEPENDENT_AMBULATORY_CARE_PROVIDER_SITE_OTHER): Payer: Self-pay | Admitting: *Deleted

## 2011-08-05 ENCOUNTER — Ambulatory Visit (HOSPITAL_COMMUNITY)
Admission: RE | Admit: 2011-08-05 | Discharge: 2011-08-05 | Disposition: A | Discharge status: Home / Self Care | Payer: Self-pay | Source: Ambulatory Visit | Attending: Obstetrics and Gynecology | Admitting: Obstetrics and Gynecology

## 2011-08-05 VITALS — BP 116/72 | HR 72 | Temp 97.4°F | Ht 59.5 in | Wt 129.5 lb

## 2011-08-05 DIAGNOSIS — Z1231 Encounter for screening mammogram for malignant neoplasm of breast: Secondary | ICD-10-CM

## 2011-08-05 DIAGNOSIS — Z1239 Encounter for other screening for malignant neoplasm of breast: Secondary | ICD-10-CM

## 2011-08-05 NOTE — Progress Notes (Signed)
No complaints today.  Pap Smear:    Pap smear not performed today. Patients last Pap smear was 12/06/09 at the [facility] and normal. Per patient she has no history of abnormal Pap smears. No Pap smear results in EPIC. Result from above Pap smear received from the [facility].  Physical exam: Breasts Breasts symmetrical. No skin abnormalities bilateral breasts. No nipple retraction bilateral breasts. No nipple discharge bilateral breasts. No lymphadenopathy. No lumps palpated bilateral breasts. No complaints of pain or tenderness on exam. Last mammogram 11/19/05.        Pelvic/Bimanual No Pap smear completed today since last Pap smear was 12/06/09 and normal. Pap smear not indicated per [facility] guidelines.

## 2011-08-05 NOTE — Patient Instructions (Signed)
Taught patient how to perform BSE and gave educational materials to take home. Patient did not need a Pap smear today due to last Pap smear was 12/06/09. Let her know [facility] will cover Pap smears every 3 years unless has a history of abnormal Pap smears. Patient is escorted to mammography for a screening mammogram. Let patient know will follow up with her within the next couple weeks with results. Patient verbalized understanding.

## 2011-08-15 ENCOUNTER — Ambulatory Visit (INDEPENDENT_AMBULATORY_CARE_PROVIDER_SITE_OTHER): Payer: Self-pay | Admitting: Obstetrics & Gynecology

## 2011-08-15 VITALS — BP 119/82 | HR 81 | Temp 98.5°F | Ht 59.0 in | Wt 130.8 lb

## 2011-08-15 DIAGNOSIS — D219 Benign neoplasm of connective and other soft tissue, unspecified: Secondary | ICD-10-CM

## 2011-08-15 DIAGNOSIS — D259 Leiomyoma of uterus, unspecified: Secondary | ICD-10-CM

## 2011-08-15 MED ORDER — MEDROXYPROGESTERONE ACETATE 10 MG PO TABS: 10.0000 mg | ORAL_TABLET | Freq: Every day | ORAL | Status: DC

## 2011-08-15 MED ORDER — LEUPROLIDE ACETATE (3 MONTH) 11.25 MG IM KIT: 11.2500 mg | PACK | Freq: Once | INTRAMUSCULAR | Status: DC

## 2011-08-15 NOTE — Patient Instructions (Signed)
Histeroscopa (Hysteroscopy) La histeroscopa es un procedimiento que se utiliza para observar el interior de tero (matriz). Puede indicarse por diferentes motivos, entre ellos:  Para evaluar una hemorragia anormal, un fibroma (tumor benigno, no cancergeno), plipos, tejido cicatrizal (adherencias), o la posibilidad de cncer de tero.   Para detectar bultos (tumores) y otras anormalidades uterinas.   Para buscar las causas por las que una mujer no queda embarazada (infertilidad), por prdidas recurrentes de embarazo (aborto espontneo) y por la prdida de un dispositivo intrauterino (DIU).   Para llevar a cabo un procedimiento de esterilizacin cerrando las trompas de Falopio que se encuentran dentro del tero.  La histeroscopa se realiza inmediatamente despus del perodo menstrual para asegurarse de que no existe embarazo. INFORME AL PROFESIONAL QUE LO ASISTE SOBRE LOS SIGUIENTES PUNTOS:  Alergias.   Medicamentos que utiliza, incluyendo hierbas, gotas oftlmicas, medicamentos de venta libre y cremas.   Uso de corticoides (por va oral o cremas).   Problemas anteriores con anestsicos.   Antecedentes de hemorragias o problemas sanguneos.   Antecedentes de cogulos sanguneos.   Posibilidad de embarazo, si correspondiera.   Cirugas previas.   Otros problemas de salud.  RIESGOS Y COMPLICACIONES  Perforacin del tero.   Hemorragia excesiva.   Infeccin.   Lesin en el cuello del tero.   Lesiones en los rganos circundantes.   Reaccin alrgica a medicamentos.   Inoculacin de lquido en exceso dentro del tero.  ANTES DEL PROCEDIMIENTO  No tome aspirina ni anticoagulantes durante la semana previa al procedimiento, o segn le hayan indicado. Esta puede ocasionar hemorragias.   Llegue al menos con 60 minutos de anticipacin al procedimiento para leer y firmar los formularios necesarios.   Pdale a alguna persona que la lleve a su casa luego del procedimiento.     Si fuma, no lo haga durante las dos semanas anteriores al procedimiento.  PROCEDIMIENTO  El mdico indicar un medicamento para que se relaje. Podr aplicarle un medicamento para adormecer la zona que rodea al cuello del tero (anestesia local) o administrarle un medicamento que la har dormir (anestesia general).   En algunos casos, el da anterior al procedimiento se coloca un medicamento en el cuello del tero. Este medicamento dilata el cuello y agranda la abertura. Este medicamento facilita la insercin del histeroscopio.   Luego le insertar un pequeo instrumento (histeroscopio) a travs de la vagina, dentro del tero. Este instrumento es similar a un telescopio con luz, del tamao de un lpiz.   Durante el procedimiento le colocarn un lquido o aire en el interior de tero, lo que le permitir al cirujano observar mejor.   En algunas ocasiones, el tejido del interior del tero se legra suavemente. Estas muestras de tejido se envan a un especialista en la observacin de clulas y muestras de tejido (patlogo). El patlogo le dar un informe a su mdico personal. Esto ayudar al mdico a decidir si necesario realizar otro tratamiento. Este informe tambin ayudar al profesional que la asiste a decidir cul es el mejor tratamiento posible, en caso que el resultado no sea normal.  DESPUS DEL PROCEDIMIENTO  Si le han administrado anestesia general se sentir mareada durante algunas horas despus del procedimiento.   Si le han aplicado un anestsico local, le indicarn que haga reposo en el centro quirrgico o en el consultorio del mdico hasta que se encuentre estable y se sienta lista para volver a su casa.   Es posible que sienta clicos durante algunos das.   Podr tener   una hemorragia, que puede variar entre una pequea mancha durante algunos das hasta una hemorragia similar a una menstruacin durante tres a siete das. Esto es normal.   Pdale a alguna persona que la lleve  hasta su domicilio.  AVERIGE LOS RESULTADOS DE SU ANLISIS Durante su visita no contar con todos los resultados de los anlisis. En este caso, tenga otra entrevista con su mdico para conocerlos. No piense que el resultado es normal si no tiene noticias de su mdico o de la institucin mdica. Es importante el seguimiento de todos los resultados de los anlisis. INSTRUCCIONES PARA EL CUIDADO DOMICILIARIO  No conduzca por 24 horas o siga las indicaciones de su mdico.   Solo tome medicamentos de venta libre o recetados para el dolor, el malestar o la fiebre, segn le haya indicado el mdico.   No tome aspirina. Esta puede ocasionar o empeorar la hemorragia.   No conduzca vehculos ni deba al alcohol mientras toma analgsicos.   Puede retomar su dieta habitual.   No use tampones, no se d duchas vaginales ni tenga relaciones sexuales durante dos semanas, o segn lo que le indique su mdico.   Descanse y duerma durante las primeras 24 a 48 horas.   Tmese la temperatura dos veces por da, durante 4  5 das. Antela. Infrmele estas temperaturas al mdico en caso que no sean normales (ms de 98.6 F o 37 C).   Tome todos los medicamentos y en la forma que el profesional le ha indicado, en el modo en que lo ha hecho.   Siga las indicaciones de su mdico con respecto a la dieta, a la actividad fsica, a levantar objetos, a conducir vehculos y a otras actividades en general.   Tome duchas en lugar de baos durante dos semanas, o segn las indicaciones de su mdico.   Si est constipada:   Tome un laxante suave con autorizacin de su mdico.   Consuma alimentos que contengan salvado.   Beba gran cantidad de lquidos para mantener la orina de tono claro o color amarillo plido.   Pdale a alguna persona que permanezca con usted durante las primeras 24 a 48 horas, especialmente si le han administrado anestesia general.   Asegrese que usted y su familia comprenden todo acerca de la  operacin y la recuperacin.   Siga las indicaciones de su mdico con respecto a las visitas de control y el Papanicolaou.  SOLICITE ATENCIN MDICA SI:  Se siente mareada o sufre un desmayo.   Tiene malestar estomacal (nuseas).   Observa una prdida vaginal anormal.   Aparece una erupcin cutnea.   Tiene una reaccin anormal o alrgica al medicamento.   Necesita analgsicos ms fuertes.  SOLICITE ATENCIN MDICA INMEDIATAMENTE SI:  La hemorragia es ms abundante que un perodo menstrual normal u observa cogulos.   La temperatura oral le sube a ms de 102 F (38.9 C) y no puede bajarla con medicamentos.   Siente clicos o el dolor aumenta y no se alivia con la medicacin.   Siente dolor abdominal que no parece estar relacionado con la misma zona en que sinti los clicos y el dolor.   Se desmaya.   Comienza a sentir dolor en la parte superior de los hombros (zona de los breteles).   Le falta el aire.  ASEGRESE QUE:  Comprende estas instrucciones.   Controlar su enfermedad.   Solicitar ayuda de inmediato si no mejora o empeora.  Document Released: 04/07/2005 Document Revised: 03/27/2011 [employer] Patient Information   2012 [employer].   

## 2011-08-15 NOTE — Progress Notes (Signed)
  [id number] Patient's last menstrual period was 07/11/2011. Patient was seen by Dr. Macon Large after a referral because of fibroid uterus and menometrorrhagia. She was given Megace. Her endometrial biopsy was benign. We discussed management of her fibroids by performing a hysteroscopy and possible myomectomy. I offered Lupron for preoperative preparation. I explained the procedure and the risk of anesthesia problems bleeding, infection, uterine damage, continued problems with menstrual periods. She would like to have surgery that preserved her uterus. She would like to schedule the procedure are described. We will order Lupron Depot 11.25 mg IM and will schedule surgery about 8 weeks. Gave her prescription for Provera 10 mg by mouth daily. Her questions were answered.  Dr. Scheryl Darter 08/15/2011 10:32 AM

## 2011-08-22 ENCOUNTER — Encounter (HOSPITAL_COMMUNITY): Payer: Self-pay | Admitting: *Deleted

## 2011-08-22 ENCOUNTER — Inpatient Hospital Stay (HOSPITAL_COMMUNITY)
Admission: AD | Admit: 2011-08-22 | Discharge: 2011-08-22 | Disposition: A | Discharge status: Home / Self Care | Payer: Self-pay | Source: Ambulatory Visit | Attending: Obstetrics & Gynecology | Admitting: Obstetrics & Gynecology

## 2011-08-22 DIAGNOSIS — N920 Excessive and frequent menstruation with regular cycle: Secondary | ICD-10-CM | POA: Insufficient documentation

## 2011-08-22 DIAGNOSIS — N921 Excessive and frequent menstruation with irregular cycle: Secondary | ICD-10-CM

## 2011-08-22 LAB — CBC
Hemoglobin: 11.3 g/dL — ABNORMAL LOW (ref 12.0–15.0)
MCV: 87.7 fL (ref 78.0–100.0)
Platelets: 227 10*3/uL (ref 150–400)
RBC: 3.81 MIL/uL — ABNORMAL LOW (ref 3.87–5.11)
WBC: 10.2 10*3/uL (ref 4.0–10.5)

## 2011-08-22 MED ORDER — MEGESTROL ACETATE 40 MG PO TABS
40.0000 mg | ORAL_TABLET | Freq: Once | ORAL | Status: DC
  Filled 2011-08-22: qty 1

## 2011-08-22 MED ORDER — OXYCODONE-ACETAMINOPHEN 5-325 MG PO TABS: 1.0000 | ORAL_TABLET | ORAL | Status: AC | PRN

## 2011-08-22 MED ORDER — MEGESTROL ACETATE 40 MG PO TABS: 40.0000 mg | ORAL_TABLET | Freq: Two times a day (BID) | ORAL | Status: DC

## 2011-08-22 MED ORDER — KETOROLAC TROMETHAMINE 60 MG/2ML IM SOLN
60.0000 mg | Freq: Once | INTRAMUSCULAR | Status: AC
  Administered 2011-08-22: 60 mg via INTRAMUSCULAR
  Filled 2011-08-22: qty 2

## 2011-08-22 NOTE — Discharge Instructions (Signed)
Stop taking the Provera and take the Megace as prescribed.  Expect your bleeding to decrease.  Return if you continue to have heavy bleeding.  Keep appointments you have at the GYN clinic for followup.

## 2011-08-22 NOTE — MAU Provider Note (Signed)
History     CSN: [id number]  Arrival date and time: 08/22/11 1932   First Provider Initiated Contact with Patient 08/22/11 2041      Chief Complaint  Patient presents with  . Vaginal Bleeding   HPI [facility] 46 y.o. Client of GYN clinic.  Returns today with heavy vaginal bleeding since Wednesday.  Having severe cramping and is now out of pain medication.  Was changed from Megace to Provera for vaginal bleeding but bleeding has been worse since then.  Client thinks that Megace is better for stopping her bleeding.  Has long term plan in clinic to get Lupron and it has been ordered.  Previously client has had hemoglobin of 5.5.  OB History    Grav Para Term Preterm Abortions TAB SAB Ect Mult Living   3 1 1  0 2 0 2 0 0 1      Past Medical History  Diagnosis Date  . Anemia   . Metrorrhagia   . Fibroids     Past Surgical History  Procedure Date  . Cholecystectomy   . Dilation and curettage of uterus     Family History  Problem Relation Age of Onset  . Anesthesia problems Neg Hx   . Hyperlipidemia Mother     History  Substance Use Topics  . Smoking status: Never Smoker   . Smokeless tobacco: Never Used  . Alcohol Use: Yes     socially    Allergies: No Known Allergies  Prescriptions prior to admission  Medication Sig Dispense Refill  . ferrous sulfate 325 (65 FE) MG tablet Take 325 mg by mouth 3 (three) times daily.      Marland Kitchen ibuprofen (ADVIL,MOTRIN) 200 MG tablet Take 800 mg by mouth every 6 (six) hours as needed. For pain      . Multiple Vitamins-Calcium (ONE-A-DAY WOMENS PO) Take 1 tablet by mouth daily.      Marland Kitchen DISCONTD: medroxyPROGESTERone (PROVERA) 10 MG tablet Take 1 tablet (10 mg total) by mouth daily.  30 tablet  2  . DISCONTD: megestrol (MEGACE) 20 MG tablet Take two tablets twice a day for 3 days, then two tablets daily  30 tablet  3  . DISCONTD: oxyCODONE-acetaminophen (PERCOCET) 5-325 MG per tablet Take 1 tablet by mouth every 4 (four) hours as  needed. pain        ROS Physical Exam   Blood pressure 149/88, pulse 86, temperature 98.2 F (36.8 C), temperature source Oral, resp. rate 20, height 4\' 11"  (1.499 m), weight 130 lb (58.968 kg), last menstrual period 07/11/2011.  Physical Exam  Nursing note and vitals reviewed. Constitutional: She is oriented to person, place, and time. She appears well-developed and well-nourished.  HENT:  Head: Normocephalic.  Eyes: EOM are normal.  Neck: Neck supple.  GI: Soft. There is tenderness.  Genitourinary:       Speculum exam: Vulva - dark blood noted, peripad 100% saturated Vagina - Full of clots and blood, large amount Cervix - small trickle of active bleeding seen Bimanual exam: deferred. Chaperone present for exam.  Musculoskeletal: Normal range of motion.  Neurological: She is alert and oriented to person, place, and time.  Skin: Skin is warm and dry.  Psychiatric: She has a normal mood and affect.    MAU Course  Procedures Toradol 60 mg IM given which reduced pain to 2/10 Consult with Dr. Macon Large re: plan of care  MDM Results for orders placed during the hospital encounter of 08/22/11 (from the past 24 hour(s))  CBC     Status: Abnormal   Collection Time   08/22/11  8:39 PM      Component Value Range   WBC 10.2  4.0 - 10.5 (K/uL)   RBC 3.81 (*) 3.87 - 5.11 (MIL/uL)   Hemoglobin 11.3 (*) 12.0 - 15.0 (g/dL)   HCT 95.6 (*) 21.3 - 46.0 (%)   MCV 87.7  78.0 - 100.0 (fL)   MCH 29.7  26.0 - 34.0 (pg)   MCHC 33.8  30.0 - 36.0 (g/dL)   RDW 08.6  57.8 - 46.9 (%)   Platelets 227  150 - 400 (K/uL)    Assessment and Plan  Menometrorrhagia  Plan Will stop Provera and restart Megace 40 mg PO BID x 5 days and then Megace 20 mg BID til seen again in clinic (#70) 1 refill rx percocet 1 po q4h as needed for pain (#30) no refills Keep appointments in GYN clinic Return if bleeding continues to be heavy.   Lin Hackmann 08/22/2011, 9:31 PM

## 2011-08-22 NOTE — MAU Note (Signed)
Pt has been going to our clinic for her heavy bleeding and fibroids; she has been on Provera but they changed her dosage and she started bleedin heavier with more severe cramping; clinic is going to call pt and schedule an appot. To get an injection that is suppose to decrease the size of the fibroids- they are going to schedule surgery when the fibroid gets smaller;

## 2011-08-22 NOTE — MAU Provider Note (Signed)
Attestation of Attending Supervision of Advanced Practitioner: Evaluation and management procedures were performed by the [facility] Fellow/PA/CNM/NP under my supervision and collaboration. Chart reviewed, and agree with management and plan.  Jaynie Collins, M.D. 08/22/2011 9:53 PM

## 2011-09-02 ENCOUNTER — Ambulatory Visit (INDEPENDENT_AMBULATORY_CARE_PROVIDER_SITE_OTHER): Payer: Self-pay

## 2011-09-02 VITALS — BP 120/75 | HR 75

## 2011-09-02 DIAGNOSIS — N926 Irregular menstruation, unspecified: Secondary | ICD-10-CM

## 2011-09-02 DIAGNOSIS — N938 Other specified abnormal uterine and vaginal bleeding: Secondary | ICD-10-CM

## 2011-09-02 DIAGNOSIS — N939 Abnormal uterine and vaginal bleeding, unspecified: Secondary | ICD-10-CM

## 2011-09-02 MED ORDER — LEUPROLIDE ACETATE (3 MONTH) 11.25 MG IM KIT
11.2500 mg | PACK | Freq: Once | INTRAMUSCULAR | Status: AC
  Administered 2011-09-02: 11.25 mg via INTRAMUSCULAR

## 2011-09-04 ENCOUNTER — Other Ambulatory Visit: Payer: Self-pay | Admitting: Obstetrics & Gynecology

## 2011-09-18 ENCOUNTER — Encounter (HOSPITAL_COMMUNITY): Payer: Self-pay | Admitting: *Deleted

## 2011-09-18 ENCOUNTER — Inpatient Hospital Stay (HOSPITAL_COMMUNITY)
Admission: AD | Admit: 2011-09-18 | Discharge: 2011-09-18 | Disposition: A | Discharge status: Home / Self Care | Payer: Self-pay | Source: Ambulatory Visit | Attending: Obstetrics & Gynecology | Admitting: Obstetrics & Gynecology

## 2011-09-18 DIAGNOSIS — N938 Other specified abnormal uterine and vaginal bleeding: Secondary | ICD-10-CM | POA: Insufficient documentation

## 2011-09-18 DIAGNOSIS — N920 Excessive and frequent menstruation with regular cycle: Secondary | ICD-10-CM

## 2011-09-18 DIAGNOSIS — N921 Excessive and frequent menstruation with irregular cycle: Secondary | ICD-10-CM

## 2011-09-18 DIAGNOSIS — N949 Unspecified condition associated with female genital organs and menstrual cycle: Secondary | ICD-10-CM | POA: Insufficient documentation

## 2011-09-18 DIAGNOSIS — R109 Unspecified abdominal pain: Secondary | ICD-10-CM | POA: Insufficient documentation

## 2011-09-18 HISTORY — DX: Pure hypercholesterolemia, unspecified: E78.00

## 2011-09-18 LAB — CBC
HCT: 35.6 % — ABNORMAL LOW (ref 36.0–46.0)
Hemoglobin: 11.7 g/dL — ABNORMAL LOW (ref 12.0–15.0)
MCH: 30.5 pg (ref 26.0–34.0)
RBC: 3.84 MIL/uL — ABNORMAL LOW (ref 3.87–5.11)

## 2011-09-18 LAB — URINALYSIS, ROUTINE W REFLEX MICROSCOPIC
Leukocytes, UA: NEGATIVE
Nitrite: NEGATIVE
Protein, ur: NEGATIVE mg/dL
Urobilinogen, UA: 0.2 mg/dL (ref 0.0–1.0)

## 2011-09-18 LAB — BASIC METABOLIC PANEL
CO2: 24 mEq/L (ref 19–32)
Calcium: 9 mg/dL (ref 8.4–10.5)
Glucose, Bld: 95 mg/dL (ref 70–99)
Sodium: 138 mEq/L (ref 135–145)

## 2011-09-18 LAB — POCT PREGNANCY, URINE: Preg Test, Ur: NEGATIVE

## 2011-09-18 LAB — URINE MICROSCOPIC-ADD ON

## 2011-09-18 MED ORDER — LACTATED RINGERS IV BOLUS (SEPSIS)
1000.0000 mL | Freq: Once | INTRAVENOUS | Status: AC
Start: 2011-09-18 — End: 2011-09-18
  Administered 2011-09-18: 1000 mL via INTRAVENOUS

## 2011-09-18 MED ORDER — OXYCODONE-ACETAMINOPHEN 5-325 MG PO TABS: 1.0000 | ORAL_TABLET | ORAL | Status: AC | PRN

## 2011-09-18 MED ORDER — HYDROMORPHONE HCL PF 1 MG/ML IJ SOLN
1.0000 mg | Freq: Once | INTRAMUSCULAR | Status: AC
  Administered 2011-09-18: 1 mg via INTRAVENOUS
  Filled 2011-09-18: qty 1

## 2011-09-18 MED ORDER — MEGESTROL ACETATE 40 MG PO TABS: ORAL_TABLET | ORAL | Status: DC

## 2011-09-18 NOTE — MAU Note (Signed)
Pt states she started having pain and bleeding  On 09/15/2011.

## 2011-09-18 NOTE — MAU Provider Note (Signed)
History    CSN: [id number] Arrival date and time: 09/18/11 1320  First Provider Initiated Contact with Patient 09/18/11 1725    Chief Complaint  Patient presents with  . Abdominal Pain   HPI Pain started on Sunday.  Subjective fevers began on Monday This is different than pain in past Started on Right side initially on Sunday - now diffuse.  Mainly suprapubic  Dizziness, pain in legs and numbness when pain comes on.  Vaginal Bleeding:  Was started on megace and Lupron and bleeding had stopped.  Bleeding started again on Saturday - Clots - 2-3 clots per void; 1 pack of pads / day, 1 per hour since in MAU. + Nausea / no vomitings. Chills in AMs, subjective fevers, night sweats, Abdominal pain worse with exertion and movement Better with rest and laying still. No association with pain.  Pertinent Gynecological History: Bleeding as above Blood transfusions: None but History of Hb to 5.5 on 2/19 Previous GYN Procedures: DNC and for miscarriage  Last mammogram: normal Date: 08/25/2011 Endometrial Biopsy X 2 - No metaplastic or dysplastic cells - Last 07/11/2011 Cervical Polp - Benign 09/02/2011  Past Medical History  Diagnosis Date  . Anemia   . Metrorrhagia   . Fibroids     Past Surgical History  Procedure Date  . Cholecystectomy   . Dilation and curettage of uterus     Family History  Problem Relation Age of Onset  . Anesthesia problems Neg Hx   . Hyperlipidemia Mother     History  Substance Use Topics  . Smoking status: Never Smoker   . Smokeless tobacco: Never Used  . Alcohol Use: Yes     socially    Allergies: No Known Allergies  Prescriptions prior to admission  Medication Sig Dispense Refill  . ferrous sulfate 325 (65 FE) MG tablet Take 325 mg by mouth 3 (three) times daily.      Marland Kitchen ibuprofen (ADVIL,MOTRIN) 200 MG tablet Take 800 mg by mouth every 6 (six) hours as needed. For pain      . megestrol (MEGACE) 40 MG tablet Take 1 tablet (40 mg total) by mouth  2 (two) times daily. For 5 days.  When bleeding decreases take one tablet twice a day.  70 tablet  1  . Multiple Vitamins-Calcium (ONE-A-DAY WOMENS PO) Take 1 tablet by mouth daily.        ROS Physical Exam   Blood pressure 127/92, pulse 80, temperature 98.6 F (37 C), temperature source Oral, resp. rate 16, height 4\' 11"  (1.499 m), weight 60.691 kg (133 lb 12.8 oz), last menstrual period 09/13/2011, SpO2 100.00%.  Physical Exam  Constitutional: She appears well-developed and well-nourished.  HENT:  Head: Normocephalic and atraumatic.  Eyes: Conjunctivae are normal.  Respiratory: Effort normal.  GI: Soft. She exhibits no distension. There is tenderness. There is no rebound and no guarding.  Genitourinary:       Uterus enlarged.  No tender.  Small amount of blood on pad.    Musculoskeletal: She exhibits no edema and no tenderness.  Neurological: She is alert.  Skin: Skin is warm and dry.    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  See below.  RIGBY, MICHAEL 09/18/2011, 5:30 PM   Patient seen and examined.  Pt having mild pain on abdominal exam.  No rebound.  Pt received Lupron 2 weeks ago.  Having bleeding now  which is nml 2 weeks after Lupron.  Now that receptors are saturated, bleeding should subside after a week then amenorrhea for 3 months.  Pt has a hysteroscopic myomectomy scheduled.  Pt chose this over hysterectomy.    Pt should increase Megace to 80 mg bid for one week.  Then one tablet a day for one week.  Pt should be able to stop Megace after then.  Percocet for pain  Return to ED with fever, heavy bleeding, worsening pain, n/v.

## 2011-09-18 NOTE — MAU Note (Signed)
Patient states she is scheduled for surgery on 6-17 through the [facility] due to having fibroids. Has been having increasing pain and bleeding since 5-25. Has periods of nausea and chills with the extreme pain.

## 2011-10-10 ENCOUNTER — Ambulatory Visit (INDEPENDENT_AMBULATORY_CARE_PROVIDER_SITE_OTHER): Payer: Self-pay | Admitting: Obstetrics & Gynecology

## 2011-10-10 ENCOUNTER — Encounter: Payer: Self-pay | Admitting: Obstetrics & Gynecology

## 2011-10-10 VITALS — BP 125/74 | HR 79 | Temp 98.2°F | Ht 59.5 in | Wt 135.5 lb

## 2011-10-10 DIAGNOSIS — N921 Excessive and frequent menstruation with irregular cycle: Secondary | ICD-10-CM

## 2011-10-10 DIAGNOSIS — D259 Leiomyoma of uterus, unspecified: Secondary | ICD-10-CM

## 2011-10-10 DIAGNOSIS — D219 Benign neoplasm of connective and other soft tissue, unspecified: Secondary | ICD-10-CM

## 2011-10-10 DIAGNOSIS — N920 Excessive and frequent menstruation with regular cycle: Secondary | ICD-10-CM

## 2011-10-10 NOTE — Progress Notes (Signed)
Patient here today to discuss upcoming surgery with Dr. Debroah Loop; hysteroscopy, D&C and possible myomectomy.  All questions answered.  She is still interested in this modality.  Still on Megace also s/p Lupron in 08/2011, reports spotting-small amount of bleeding daily. Bleeding precautions reviewed.  She will proceed with surgery as scheduled.  Encounter done with the help of a Spanish interpreter.  Total encounter time 15 minutes

## 2011-10-10 NOTE — Patient Instructions (Addendum)
Histeroscopa (Hysteroscopy) La histeroscopa es un procedimiento que se [place] para observar el interior de tero Administrator, Civil Service). Puede indicarse por diferentes motivos, entre ellos:  Para evaluar una hemorragia anormal, un fibroma (tumor benigno, no cancergeno), plipos, tejido cicatrizal (adherencias), o la posibilidad de cncer de tero.   Para detectar bultos (tumores) y otras anormalidades uterinas.   Para buscar las causas por las que una mujer no queda embarazada (infertilidad), por prdidas recurrentes de Psychiatrist (aborto espontneo) y por la prdida de un dispositivo intrauterino (DIU).   Para llevar a cabo un procedimiento de esterilizacin cerrando las trompas de [employer] se encuentran dentro del tero.  La histeroscopa se realiza inmediatamente despus del perodo menstrual para asegurarse de que no existe embarazo. INFORME AL PROFESIONAL QUE LO ASISTE SOBRE LOS SIGUIENTES PUNTOS:  Alergias.   Medicamentos que [place], incluyendo hierbas, gotas oftlmicas, medicamentos de [place] y Control and instrumentation engineer.   Uso de corticoides (por va oral o cremas).   Problemas anteriores con anestsicos.   Antecedentes de hemorragias o problemas sanguneos.   Antecedentes de cogulos sanguneos.   Posibilidad de embarazo, si correspondiera.   Cirugas previas.   Otros problemas de [place].  RIESGOS Y COMPLICACIONES  Perforacin del tero.   Hemorragia excesiva.   Infeccin.   Lesin en el cuello del tero.   Lesiones en los rganos circundantes.   Reaccin alrgica a medicamentos.   Inoculacin de lquido en exceso dentro del tero.  ANTES DEL PROCEDIMIENTO  No tome aspirina ni anticoagulantes durante la semana previa al procedimiento, o segn le hayan indicado. Esta puede ocasionar hemorragias.   Llegue al menos con 60 minutos de anticipacin al procedimiento para leer y Oceanographer los formularios necesarios.   Pdale a alguna persona que la lleve a su casa luego del procedimiento.     Si fuma, no lo haga durante las [employer] al procedimiento.  PROCEDIMIENTO  El mdico indicar un medicamento para que se relaje. Podr aplicarle un medicamento para adormecer la zona que rodea al cuello del tero (anestesia local) o administrarle un medicamento que la har dormir (anestesia general).   En algunos casos, el da anterior al procedimiento se coloca un medicamento en el cuello del tero. Este medicamento dilata el cuello y [place] la abertura. Este medicamento facilita la insercin del histeroscopio.   Luego le insertar un pequeo instrumento (histeroscopio) a travs de la vagina, dentro del tero. Este instrumento es similar a un telescopio con luz, del tamao de un lpiz.   Durante el IT consultant un lquido o aire en el interior de [employer], lo que le permitir al cirujano observar mejor.   En algunas ocasiones, el tejido del interior del tero se legra suavemente. Estas muestras de tejido se envan a un especialista en la observacin de clulas y muestras de tejido (patlogo). El patlogo le dar un informe a su mdico personal. Esto ayudar al mdico a decidir si necesario Engineer, manufacturing. Este informe tambin ayudar al profesional que la asiste a decidir cul es el mejor tratamiento posible, en caso que el resultado no sea normal.  DESPUS DEL PROCEDIMIENTO  Si le han administrado anestesia general se sentir mareada durante algunas horas despus del procedimiento.   Si le han aplicado un anestsico local, le indicarn que haga reposo en el centro quirrgico o en el consultorio del mdico hasta que se encuentre estable y se sienta lista para volver a Pensions consultant.   Es posible que sienta clicos durante [employer].   Podr Warehouse manager  una hemorragia, que puede variar entre una pequea mancha durante algunos das hasta una hemorragia similar a una menstruacin durante tres a [place]. Esto es normal.   Pdale a alguna persona que la lleve  hasta su domicilio.  AVERIGE LOS RESULTADOS DE SU ANLISIS Durante su visita no contar con todos los [employer]. En este caso, tenga otra entrevista con su mdico para conocerlos. No piense que el resultado es normal si no tiene noticias de su mdico o de la institucin mdica. Es Copy seguimiento de todos los [place] de [place]. INSTRUCCIONES PARA EL CUIDADO DOMICILIARIO  No conduzca por 24 horas o siga las indicaciones de su mdico.   Solo tome medicamentos de venta libre o recetados para Chief Technology Officer, Environmental health practitioner o la [place], segn le haya indicado el mdico.   No tome aspirina. Esta puede ocasionar o Environmental manager.   No conduzca vehculos ni deba al alcohol mientras toma analgsicos.   Puede retomar su dieta habitual.   No use tampones, no se d duchas vaginales ni tenga relaciones sexuales [address], o segn lo que le indique su mdico.   Descanse y duerma durante las primeras 24 a 48 horas.   Tmese la [employer] por da, durante [address]  [address] [place]. Infrmele estas temperaturas al mdico en caso que no sean normales (ms de 98.6 F o 37 C).   Tome [employer] medicamentos y en la forma que el profesional le ha indicado, en el modo en que lo ha hecho.   Siga las indicaciones de su mdico con respecto a la dieta, a la actividad fsica, a Astronomer, a Proofreader y a Tax inspector en general.   Lexicographer en lugar de [employer], o segn las indicaciones de su mdico.   Si est constipada:   Tome un laxante suave con autorizacin de su mdico.   Consuma alimentos que contengan salvado.   Beba gran cantidad de lquidos para mantener la orina de tono claro o color amarillo plido.   Pdale a alguna persona que permanezca con usted durante las primeras 24 a 48 horas, especialmente si le han administrado anestesia general.   Asegrese que usted y su familia comprenden todo acerca de la  operacin y la recuperacin.   Siga las indicaciones de su mdico con respecto a las visitas de control y el [place].  SOLICITE ATENCIN MDICA SI:  Se siente mareada o sufre un desmayo.   Tiene Programme researcher, broadcasting/film/video (nuseas).   Observa una prdida vaginal anormal.   Aparece una erupcin cutnea.   Tiene una reaccin anormal o alrgica al medicamento.   Necesita analgsicos ms fuertes.  SOLICITE ATENCIN MDICA INMEDIATAMENTE SI:  La hemorragia es ms abundante que un perodo menstrual normal u observa cogulos.   La temperatura oral le sube a ms de 102 F (38.9 C) y no puede bajarla con medicamentos.   Siente clicos o el dolor aumenta y no se alivia con Tourist information centre manager.   Siente dolor abdominal que no parece estar relacionado con la misma zona en que sinti los clicos y Chief Technology Officer.   Se desmaya.   Comienza a Water engineer parte superior de los hombros (zona de los breteles).   Le falta el aire.  ASEGRESE QUE:  Comprende estas instrucciones.   Controlar su enfermedad.   Solicitar ayuda de inmediato si no mejora o empeora.  Document Released: 04/07/2005 Document Revised: 03/27/2011 ExitCare Patient Information  [address], [place].  Dilatacin y curetaje o curetaje por aspiracin  (Dilation and Curettage or Vacuum Curettage) La dilatacin y curetaje y el curetaje por aspiracin son procedimientos menores. Consiste en la apertura (dilatacin) del cuello del tero y el raspado (curetaje) en la superficie interna del tero. Durante este procedimiento, el tejido del interior del tero se raspa suavemente. Durante el curetaje por aspiracin, se utiliza una succin suave para extirpar tejidos del tero.  Puede indicarse para realizar un diagnstico o tener un propsito teraputico. Como procedimiento diagnstico, se realiza para examinar los tejidos del tero. El examen del tejido puede determinar las causas o las opciones de tratamiento para diversos sntomas. Un  diagnstico con curetaje se realiza en caso de presentarse los siguientes sntomas:   Sangrado irregular del tero.   Hemorragias con cogulos.   Pierde sangre entre los perodos [employer].   Perodos menstruales prolongados.   Sangrado luego de la menopausia.   Falta de periodo menstrual (amenorrea).   Cambio en el tamao y forma del tero.  Un curetaje teraputico se realiza para extirpar tejidos, sangre, o un dispositivo anticonceptivo. El curetaje teraputico se realiza en caso de presentarse las siguientes enfermedades:   Remocin del DIU (dispositivo intrauterino).   Remocin de placenta retenida luego del parto . La placenta retenida puede causar una hemorragia lo suficientemente grave como para requerir transfusiones o Chiropractor una infeccin.   Cesacin del embarazo.   Aborto espontneo.   Extirpacin de plipos en el interior del tero.   Extirpacin de fibromas no frecuentes (bultos no cancerosos).  HGALE SABER A SU MDICO SOBRE:  Alergias a alimentos o medicamentos.   Medicamentos que [place], incluyendo vitaminas, hierbas, gotas oftlmicas, medicamentos de venta libre y cremas.   Uso de corticoides (por va oral o cremas).   Problemas anteriores debido a anestsicos o a medicamentos que [employer] sensibilidad.   Antecedentes de hemorragias o cogulos sanguneos.   Cirugas anteriores.   Otros problemas de salud, incluyendo diabetes y problemas renales.   Posibilidad de embarazo, si corresponde.  RIESGOS Y COMPLICACIONES  Hemorragia excesiva.   Infeccin en el tero.   Lesin en el cuello del tero.   Desarrollo de tejido Designer, television/film set (adherencias) dentro del tero que posteriormente causan hemorragia menstrual en cantidad anormal.   Complicaciones de la anestesia general, si se ha usado.   Perforacin del tero. Pero es poco comn.  ANTES DEL PROCEDIMIENTO  Coma y beba antes del procedimiento slo lo que le indique el mdico.    Pdale a alguna persona que la lleve a su casa.  PROCEDIMIENTO  Este procedimiento puede ser [employer] un hospital, como paciente ambulatorio o en el consultorio del mdico.   Podrn administrarle una anestesia general o local, alrededor del cuello del tero.   Deber recostarse sobre la espalda con las piernas en los estribos.   [employer]OK formas en las que el cuello del tero puede ser ablandado dilatado. Ellas son:   Tomando medicamentos.   Insertado unas varillas delgadas (laminarias) en el cuello del tero.   Con un instrumento curvo (cureta) rasparn las clulas de la superficie interna del tero, las que luego sern retiradas.  El procedimiento demora entre 15 y 30 minutos.  DESPUS DEL PROCEDIMIENTO  Descansar en una sala de recuperacin hasta que se sienta estable y lista para volver a su casa.   Tendr que solicitarle a alguna persona que la lleve a su casa.   Podr tener nuseas o vmitos si le han administrado  anestesia general.   Sentir dolor de garganta si le han colocado un tubo durante la anestesia general.   Podr sentir clicos leves tener una pequea hemorragia entre 2 das y 2 semanas despus del procedimiento.   Luego del procedimiento, el tero formar [place]. Esto podr retrasar su perodo menstrual.  Document Released: 09/24/2007 Document Revised: 03/27/2011 [facility] Patient Information 2012 [place], [place].

## 2011-10-14 ENCOUNTER — Encounter (HOSPITAL_COMMUNITY): Payer: Self-pay | Admitting: *Deleted

## 2011-10-14 ENCOUNTER — Inpatient Hospital Stay (HOSPITAL_COMMUNITY)
Admission: AD | Admit: 2011-10-14 | Discharge: 2011-10-14 | Disposition: A | Discharge status: Home / Self Care | Payer: Self-pay | Source: Ambulatory Visit | Attending: Obstetrics and Gynecology | Admitting: Obstetrics and Gynecology

## 2011-10-14 DIAGNOSIS — D219 Benign neoplasm of connective and other soft tissue, unspecified: Secondary | ICD-10-CM

## 2011-10-14 DIAGNOSIS — D259 Leiomyoma of uterus, unspecified: Secondary | ICD-10-CM | POA: Insufficient documentation

## 2011-10-14 DIAGNOSIS — R109 Unspecified abdominal pain: Secondary | ICD-10-CM | POA: Insufficient documentation

## 2011-10-14 NOTE — MAU Note (Signed)
Pt with c/o pain to abdomen tonight since 2300.  Pt with hx of fibroids.  Pt to have surgery on 7/15 with Dr Debroah Loop.  Pt was seen in clinic on 6/21.  Pt took one oxycodone at 2300 from an old prescription.  Pt has prescription for oxycodone that she is to get filled.

## 2011-10-14 NOTE — MAU Provider Note (Signed)
Agree with above note.  Diana Lewis 10/14/2011 7:24 AM

## 2011-10-14 NOTE — Discharge Instructions (Signed)
Fibromas (Fibroids) Le han diagnosticado un fibroma. Los fibromas son tumores (bultos) de tejido muscular blando que pueden aparecer en cualquiel lugar del organismo de una mujer. Generalmente se desarrollan en el tero (matriz). El sntoma (problema) ms comn de los fibromas es la hemorragia. Con el tiempo puede causar anemia (poca cantidad de glbulos rojos). Otros sntomas incluyen sensaciones de opresin y dolor en la pelvis. El diagnstico (determinar cul es el problema) se realiza a travs del examen fsico. A veces se utilizan pruebas como el ultrasonido. Este examen es de gran utilidad cuando los fibromas se localizan alrededor de los ovarios y tambin para detectar tumores. TRATAMIENTO  La mayor parte de los fibromas no necesitan tratamiento mdico o quirrgico. A veces es necesario realizar una biopsia (tomar muestras de tejido) de las paredes del tero para descartar cncer. Si se ha descartado cncer y slo hay una pequea cantidad de hemorragia, el problema debe mantenerse bajo observacin.   Un tratamiento hormonal puede mejorar el problema.   Cuando se hace necesario implementar una ciruga, sta consistir en la extirpacin del fibroma. Despus de la extirpacin de fibromas, no ser posible tener un parto por va vaginal. Todo depende de la ubicacin y la extensin de la ciruga. Cuando se produce un embarazo en presencia de un fibroma, generalmente es normal.   El profesional que lo asiste ayudar a determinar que tratamiento es el mejor para usted.  INSTRUCCIONES PARA EL CUIDADO DOMICILIARIO  Utilice los medicamentos de venta libre o de prescripcin para el dolor, el malestar o la fiebre, segn se lo indique el profesional que lo asiste. No utilice aspirina, ya que puede agravar la hemorragias.   Si las menstruaciones (perodos) son muy abundantes, registre el nmero de apsitos o tampones que utiliza por mes. Lleve esta informacin al profesional que la asiste. Esto puede ayudar  a determinar el mejor tratamiento para usted.  SOLICITE ATENCIN MDICA DE INMEDIATO SI:  Siente dolor o calambres en la pelvis que no puede controlar con los medicamentos, o experimenta un repentino aumento del dolor.   La hemorragia plvica aumenta entre las menstruaciones o durante el transcurso de las mismas.   Si se siente mareada o se desmaya.   Presenta dolor abdominal (en el vientre) que se hace cada vez ms intenso.  Document Released: 04/07/2005 Document Revised: 03/27/2011 ExitCare Patient Information 2012 [employer]. 

## 2011-10-14 NOTE — MAU Provider Note (Signed)
  History     CSN: [id number]  Arrival date and time: 10/14/11 0104   First Provider Initiated Contact with Patient 10/14/11 [phone]      Chief Complaint  Patient presents with  . Abdominal Pain   HPI This is a 46 y.o. female who presents with c/o lower abdominal pain tonight. Took one percocet, but did not wait for it to work. Came here because "she was scared when it hurt so bad, she thought the medicine was not going to work".  States pain was a 10  And upon arrival to triage was down to 8.  RN Note: SAYS [facility] INTERPRETER- DEBBIE, HAS HAD IRREG VAG BLEEDING SINCE JAN-2013. UNSURE OF LMP. WAS IN CLINIC ON Friday- SAW DR Terisa Starr- FOLLOW-UP- SCHEDULE FOR SURGERY FOR 11-03-2011- TO HAVE FIBROIDS REMOVED. TOLD DR ABOUT PAIN- GAVE RX - TOOK MED LAST 2300- TOOK 1 PILL. HAS SMALL AMT VAG BLEEDING NOW. DR ARNOLD TO DO SURGERY      OB History    Grav Para Term Preterm Abortions TAB SAB Ect Mult Living   3 1 1  0 2 0 2 0 0 1      Past Medical History  Diagnosis Date  . Anemia   . Metrorrhagia   . Fibroids   . Hypercholesterolemia     diet controlled cholesterol    Past Surgical History  Procedure Date  . Cholecystectomy   . Dilation and curettage of uterus     x 2    Family History  Problem Relation Age of Onset  . Anesthesia problems Neg Hx   . Hyperlipidemia Mother     History  Substance Use Topics  . Smoking status: Never Smoker   . Smokeless tobacco: Never Used  . Alcohol Use: Yes     socially    Allergies: No Known Allergies  No prescriptions prior to admission    ROS Denies fever or N/V/D/C.    Physical Exam   Blood pressure 120/70, pulse 82, temperature 97.9 F (36.6 C), temperature source Oral, resp. rate 18, height 4\' 11"  (1.499 m), weight 137 lb 2 oz (62.199 kg), last menstrual period 09/13/2011.  Physical Exam  Constitutional: She appears well-developed and well-nourished. No distress.  HENT:  Head: Normocephalic.  Cardiovascular: Normal rate.     Respiratory: Effort normal.  GI: Soft. She exhibits no distension and no mass. There is no tenderness. There is no rebound and no guarding.  Genitourinary:       Declines pelvic exam  Musculoskeletal: Normal range of motion.  Neurological: She is alert.  Skin: Skin is warm and dry.  Psychiatric: She has a normal mood and affect.    MAU Course  Procedures  MDM When I entered room patient stated that she feels better wantst to gohome. Pryor Ochoa is between 0-1  Assessment and Plan  A:  Abdominal pain, now resolved      Fibroids, scheduled for surgery  P:  Discharge home per pt request      Pt declines further treatment      Followup in clinic  [facility] 10/14/2011, 2:23 AM

## 2011-10-14 NOTE — MAU Note (Addendum)
Pt desires to go home.  MAU provider to be notified.

## 2011-10-14 NOTE — MAU Note (Signed)
SAYS [facility] INTERPRETER-  DEBBIE,    HAS HAD IRREG VAG BLEEDING SINCE JAN-2013.   UNSURE OF LMP.   WAS IN CLINIC ON Friday-  SAW DR Terisa Starr- FOLLOW-UP- SCHEDULE FOR SURGERY FOR 11-03-2011- TO HAVE FIBROIDS REMOVED.     TOLD DR ABOUT PAIN-  GAVE RX - TOOK MED LAST  2300- TOOK 1 PILL.  HAS SMALL AMT VAG BLEEDING NOW.  DR ARNOLD   TO DO SURGERY

## 2011-10-24 ENCOUNTER — Encounter (HOSPITAL_COMMUNITY): Payer: Self-pay

## 2011-10-24 ENCOUNTER — Encounter (HOSPITAL_COMMUNITY)
Admission: RE | Admit: 2011-10-24 | Discharge: 2011-10-24 | Disposition: A | Discharge status: Home / Self Care | Payer: Self-pay | Source: Ambulatory Visit | Attending: Obstetrics & Gynecology | Admitting: Obstetrics & Gynecology

## 2011-10-24 LAB — CBC
HCT: 41.4 % (ref 36.0–46.0)
Hemoglobin: 13.5 g/dL (ref 12.0–15.0)
MCHC: 32.6 g/dL (ref 30.0–36.0)
WBC: 8 10*3/uL (ref 4.0–10.5)

## 2011-10-24 LAB — SURGICAL PCR SCREEN: Staphylococcus aureus: NEGATIVE

## 2011-10-24 NOTE — Patient Instructions (Addendum)
YOUR PROCEDURE IS SCHEDULED ON:10/27/11  ENTER THROUGH THE MAIN ENTRANCE OF [facility] AT:0830 am  USE DESK PHONE AND DIAL [id number] TO INFORM us OF YOUR ARRIVAL  CALL [phone] IF YOU HAVE ANY QUESTIONS OR PROBLEMS PRIOR TO YOUR ARRIVAL.  REMEMBER:  DO NOT EAT OR DRINK AFTER MIDNIGHT :Sunday     YOU MAY BRUSH YOUR TEETH THE MORNING OF SURGERY   TAKE THESE MEDICINES THE DAY OF SURGERY WITH SIP OF WATER:none   DO NOT WEAR JEWELRY, EYE MAKEUP, LIPSTICK OR DARK FINGERNAIL POLISH DO NOT WEAR LOTIONS  DO NOT SHAVE FOR 48 HOURS PRIOR TO SURGERY  YOU WILL NOT BE ALLOWED TO DRIVE YOURSELF HOME.

## 2011-10-27 ENCOUNTER — Ambulatory Visit (HOSPITAL_COMMUNITY): Payer: Self-pay | Admitting: Anesthesiology

## 2011-10-27 ENCOUNTER — Ambulatory Visit (HOSPITAL_COMMUNITY)
Admission: RE | Admit: 2011-10-27 | Discharge: 2011-10-27 | Disposition: A | Discharge status: Home / Self Care | Payer: Self-pay | Source: Ambulatory Visit | Attending: Obstetrics & Gynecology | Admitting: Obstetrics & Gynecology

## 2011-10-27 ENCOUNTER — Encounter (HOSPITAL_COMMUNITY)
Admission: RE | Disposition: A | Discharge status: Home / Self Care | Payer: Self-pay | Source: Ambulatory Visit | Attending: Obstetrics & Gynecology

## 2011-10-27 ENCOUNTER — Encounter (HOSPITAL_COMMUNITY): Payer: Self-pay | Admitting: Anesthesiology

## 2011-10-27 ENCOUNTER — Other Ambulatory Visit (HOSPITAL_COMMUNITY): Payer: Self-pay

## 2011-10-27 DIAGNOSIS — N920 Excessive and frequent menstruation with regular cycle: Secondary | ICD-10-CM

## 2011-10-27 DIAGNOSIS — N9089 Other specified noninflammatory disorders of vulva and perineum: Secondary | ICD-10-CM

## 2011-10-27 DIAGNOSIS — Z01818 Encounter for other preprocedural examination: Secondary | ICD-10-CM | POA: Insufficient documentation

## 2011-10-27 DIAGNOSIS — Z01812 Encounter for preprocedural laboratory examination: Secondary | ICD-10-CM

## 2011-10-27 DIAGNOSIS — D250 Submucous leiomyoma of uterus: Secondary | ICD-10-CM

## 2011-10-27 HISTORY — PX: HYSTEROSCOPY: SHX211

## 2011-10-27 SURGERY — DILATATION & CURETTAGE/HYSTEROSCOPY WITH VERSAPOINT RESECTION
Anesthesia: General | Site: Vagina | Wound class: Clean Contaminated

## 2011-10-27 MED ORDER — PROPOFOL 10 MG/ML IV EMUL
INTRAVENOUS | Status: DC | PRN
  Administered 2011-10-27: 130 mg via INTRAVENOUS

## 2011-10-27 MED ORDER — LIDOCAINE HCL (CARDIAC) 20 MG/ML IV SOLN
INTRAVENOUS | Status: DC | PRN
  Administered 2011-10-27: 40 mg via INTRAVENOUS

## 2011-10-27 MED ORDER — LACTATED RINGERS IV SOLN
INTRAVENOUS | Status: DC
  Administered 2011-10-27 (×2): via INTRAVENOUS

## 2011-10-27 MED ORDER — KETOROLAC TROMETHAMINE 30 MG/ML IJ SOLN
INTRAMUSCULAR | Status: DC | PRN
  Administered 2011-10-27: 30 mg via INTRAVENOUS

## 2011-10-27 MED ORDER — BUPIVACAINE HCL (PF) 0.25 % IJ SOLN
INTRAMUSCULAR | Status: DC | PRN
  Administered 2011-10-27: 13 mL

## 2011-10-27 MED ORDER — MIDAZOLAM HCL 2 MG/2ML IJ SOLN
INTRAMUSCULAR | Status: AC
  Filled 2011-10-27: qty 2

## 2011-10-27 MED ORDER — DEXAMETHASONE SODIUM PHOSPHATE 10 MG/ML IJ SOLN
INTRAMUSCULAR | Status: DC | PRN
  Administered 2011-10-27: 10 mg via INTRAVENOUS

## 2011-10-27 MED ORDER — FENTANYL CITRATE 0.05 MG/ML IJ SOLN
INTRAMUSCULAR | Status: AC
  Filled 2011-10-27: qty 5

## 2011-10-27 MED ORDER — FENTANYL CITRATE 0.05 MG/ML IJ SOLN
INTRAMUSCULAR | Status: AC
  Filled 2011-10-27: qty 2

## 2011-10-27 MED ORDER — PROPOFOL 10 MG/ML IV EMUL
INTRAVENOUS | Status: AC
  Filled 2011-10-27: qty 20

## 2011-10-27 MED ORDER — ONDANSETRON HCL 4 MG/2ML IJ SOLN
INTRAMUSCULAR | Status: DC | PRN
  Administered 2011-10-27: 4 mg via INTRAVENOUS

## 2011-10-27 MED ORDER — SODIUM CHLORIDE 0.9 % IR SOLN
Status: DC | PRN
  Administered 2011-10-27: 5

## 2011-10-27 MED ORDER — HYDROCODONE-ACETAMINOPHEN 5-500 MG PO TABS: 1.0000 | ORAL_TABLET | ORAL | Status: AC | PRN

## 2011-10-27 MED ORDER — KETOROLAC TROMETHAMINE 30 MG/ML IJ SOLN
INTRAMUSCULAR | Status: AC
  Filled 2011-10-27: qty 1

## 2011-10-27 MED ORDER — ONDANSETRON HCL 4 MG/2ML IJ SOLN
INTRAMUSCULAR | Status: AC
  Filled 2011-10-27: qty 2

## 2011-10-27 MED ORDER — FENTANYL CITRATE 0.05 MG/ML IJ SOLN
INTRAMUSCULAR | Status: DC | PRN
  Administered 2011-10-27: 25 ug via INTRAVENOUS
  Administered 2011-10-27: 100 ug via INTRAVENOUS
  Administered 2011-10-27 (×3): 25 ug via INTRAVENOUS
  Administered 2011-10-27: 50 ug via INTRAVENOUS
  Administered 2011-10-27: 100 ug via INTRAVENOUS

## 2011-10-27 MED ORDER — LIDOCAINE HCL (CARDIAC) 20 MG/ML IV SOLN
INTRAVENOUS | Status: AC
  Filled 2011-10-27: qty 5

## 2011-10-27 MED ORDER — FENTANYL CITRATE 0.05 MG/ML IJ SOLN: 25.0000 ug | INTRAMUSCULAR | Status: DC | PRN

## 2011-10-27 MED ORDER — MIDAZOLAM HCL 5 MG/5ML IJ SOLN
INTRAMUSCULAR | Status: DC | PRN
  Administered 2011-10-27: 2 mg via INTRAVENOUS

## 2011-10-27 MED ORDER — LACTATED RINGERS IV SOLN: INTRAVENOUS | Status: DC

## 2011-10-27 MED ORDER — DEXAMETHASONE SODIUM PHOSPHATE 10 MG/ML IJ SOLN
INTRAMUSCULAR | Status: AC
  Filled 2011-10-27: qty 1

## 2011-10-27 MED ORDER — BUPIVACAINE HCL (PF) 0.25 % IJ SOLN
INTRAMUSCULAR | Status: AC
  Filled 2011-10-27: qty 30

## 2011-10-27 SURGICAL SUPPLY — 21 items
CANISTER SUCTION 2500CC (MISCELLANEOUS) ×11 IMPLANT
CATH ROBINSON RED A/P 16FR (CATHETERS) ×2 IMPLANT
CLOTH BEACON ORANGE TIMEOUT ST (SAFETY) ×2 IMPLANT
CONTAINER PREFILL 10% NBF 60ML (FORM) ×4 IMPLANT
CORD ACTIVE DISPOSABLE (ELECTRODE)
CORD ELECTRO ACTIVE DISP (ELECTRODE) IMPLANT
ELECT LOOP GYNE PRO 24FR (CUTTING LOOP)
ELECT REM PT RETURN 9FT ADLT (ELECTROSURGICAL) ×2
ELECTRODE LOOP GYNE PRO 24FR (CUTTING LOOP) IMPLANT
ELECTRODE REM PT RTRN 9FT ADLT (ELECTROSURGICAL) ×1 IMPLANT
ELECTRODE RT ANGLE VERSAPOINT (CUTTING LOOP) ×2 IMPLANT
GLOVE BIO SURGEON STRL SZ 6.5 (GLOVE) ×2 IMPLANT
GLOVE BIOGEL PI IND STRL 7.0 (GLOVE) ×2 IMPLANT
GLOVE BIOGEL PI INDICATOR 7.0 (GLOVE) ×2
GOWN PREVENTION PLUS LG XLONG (DISPOSABLE) ×4 IMPLANT
GOWN STRL REIN XL XLG (GOWN DISPOSABLE) ×2 IMPLANT
PACK HYSTEROSCOPY LF (CUSTOM PROCEDURE TRAY) ×2 IMPLANT
SET BERKELEY SUCTION TUBING (SUCTIONS) ×1 IMPLANT
TOWEL OR 17X24 6PK STRL BLUE (TOWEL DISPOSABLE) ×4 IMPLANT
VACURETTE 8 RIGID CVD (CANNULA) ×1 IMPLANT
WATER STERILE IRR 1000ML POUR (IV SOLUTION) ×2 IMPLANT

## 2011-10-27 NOTE — Anesthesia Preprocedure Evaluation (Addendum)

## 2011-10-27 NOTE — H&P (Signed)
Diana Lewis is an 46 y.o. female. [id number] No LMP recorded. She has had irregular bleeding since mid-May.  Patient was seen by Dr. Macon Large after a referral because of fibroid uterus and menometrorrhagia. Her ultrasound showed 2 submucous fibroids. She was given Megace. Her endometrial biopsy was benign. We discussed management of her fibroids by performing a hysteroscopy and possible myomectomy. I explained the procedure and the risk of anesthesia problems bleeding, infection, uterine damage, continued problems with menstrual periods. She would like to have surgery that preserved her uterus. She would like to schedule the procedure are described. She received Lupron Depot 11.25 mg IM. Her questions were answered. She has a vulvar skin tag she would like removed and that will be done as well.    Pertinent Gynecological History: Menses: Daily, sometimes heavier than other days. Bleeding: intermenstrual bleeding Contraception: none DES exposure: denies Blood transfusions: For severe anemia Sexually transmitted diseases: no past history Previous GYN Procedures:   Last mammogram:  Date:   Last pap: normal Date:    Menstrual History:    Past Medical History  Diagnosis Date  . Anemia   . Metrorrhagia   . Fibroids   . Hypercholesterolemia     diet controlled cholesterol    Past Surgical History  Procedure Date  . Cholecystectomy   . Dilation and curettage of uterus     x 2    Family History  Problem Relation Age of Onset  . Anesthesia problems Neg Hx   . Hyperlipidemia Mother     Social History:  reports that she has never smoked. She has never used smokeless tobacco. She reports that she drinks alcohol. She reports that she does not use illicit drugs.  Allergies: No Known Allergies  Prescriptions prior to admission  Medication Sig Dispense Refill  . ferrous sulfate 325 (65 FE) MG tablet Take 325 mg by mouth 3 (three) times daily.      . megestrol (MEGACE) 40 MG  tablet Take 40 mg by mouth daily.      . Multiple Vitamins-Calcium (ONE-A-DAY WOMENS PO) Take 1 tablet by mouth daily.        Review of Systems  Constitutional: Negative.    No bleeding today Blood pressure 122/89, pulse 73, temperature 98.1 F (36.7 C), temperature source Oral, resp. rate 18, SpO2 100.00%. Physical Exam  Constitutional: She is oriented to person, place, and time. She appears well-developed and well-nourished. No distress.  Neck: Normal range of motion.  Cardiovascular: Normal rate and regular rhythm.   Respiratory: Effort normal and breath sounds normal.  GI: Soft. She exhibits no mass.  Genitourinary:       1.5 cm non tender skin tag left vulva  Neurological: She is alert and oriented to person, place, and time.  Skin: Skin is warm and dry.  Psychiatric: She has a normal mood and affect.   CBC    Component Value Date/Time   WBC 8.0 10/24/2011 1351   RBC 4.45 10/24/2011 1351   HGB 13.5 10/24/2011 1351   HCT 41.4 10/24/2011 1351   PLT 289 10/24/2011 1351   MCV 93.0 10/24/2011 1351   MCH 30.3 10/24/2011 1351   MCHC 32.6 10/24/2011 1351   RDW 13.7 10/24/2011 1351     Results for orders placed during the hospital encounter of 10/27/11 (from the past 24 hour(s))  PREGNANCY, URINE     Status: Normal   Collection Time   10/27/11  8:23 AM      Component Value Range  Preg Test, Ur NEGATIVE  NEGATIVE    *RADIOLOGY REPORT*  Clinical Data: Vaginal bleeding and pelvic pain for the past 2  months. Right adnexal tenderness. Known uterine fibroids.  TRANSABDOMINAL AND TRANSVAGINAL ULTRASOUND OF PELVIS  Technique: Both transabdominal and transvaginal ultrasound  examinations of the pelvis were performed. Transabdominal technique  was performed for global imaging of the pelvis including uterus,  ovaries, adnexal regions, and pelvic cul-de-sac.  Comparison: 09/03/2010.  It was necessary to proceed with endovaginal exam following the  transabdominal exam to visualize the uterus and  ovaries in better  detail.  Findings:  Uterus: Enlarged and containing multiple masses. The uterus  measures 12.8 x 7.4 x 7.1 cm. Two dominant masses are again  demonstrated. One measures 3.8 x 3.9 x 3.7 cm and the other  measures 3.8 x 3.6 x 3.4 cm. These were previously shown to have  submucosal components.  Endometrium: Not visualized due to the uterine masses obscuring the  endometrium.  Right ovary: Normal appearance/no adnexal mass  Left ovary: Normal appearance/no adnexal mass  Other findings: No free fluid  IMPRESSION:  Little change in previously demonstrated uterine fibroids. These  are obscuring the endometrium. No acute abnormality.  Original Report Authenticated By: Darrol Angel, M.D.        No results found.  Assessment/Plan: Submucous fibroid, for hysteroscopic removal. Resection of vulvar skin tag. Consent signed, questions answered.  Diana Lewis 10/27/2011, 9:41 AM

## 2011-10-27 NOTE — Transfer of Care (Signed)
Immediate Anesthesia Transfer of Care Note  Patient: Diana Lewis  Procedure(s) Performed: Procedure(s) (LRB): DILATATION & CURETTAGE/HYSTEROSCOPY WITH VERSAPOINT RESECTION (N/A)  Patient Location: PACU  Anesthesia Type: General  Level of Consciousness: sedated  Airway & Oxygen Therapy: Patient Spontanous Breathing and Patient connected to nasal cannula oxygen  Post-op Assessment: Report given to PACU RN and Post -op Vital signs reviewed and stable  Post vital signs: stable  Complications: No apparent anesthesia complications

## 2011-10-27 NOTE — Op Note (Signed)
Procedure: Hysteroscopic myomectomy, resection of vulvar skin tag Preoperative diagnosis: Submucous uterine fibroids with menometrorrhagia. Left vulvar skin tag Postoperative diagnosis: Same Surgeon: Dr. Scheryl Darter Anesthesia: Gen. endotracheal, intracervical block Specimen: Left vulvar skin tag and fragments of uterine fibroids Estimated blood loss: 100 mL Fluid deficit: Less than 200 mL Complications: None Counts: Correct  Patient gave written consent for hysteroscopy, resection of 2 uterine fibroids seen on ultrasound, and removal of a left vulvar skin tag. Patient identification was confirmed and she was brought to the operating room and general anesthesia was induced. She was placed in dorsal lithotomy position. Exam under anesthesia revealed a 10 week size uterus. The perineum and vagina were sterilely prepped and draped. The bladder was drained with a red rubber catheter. Speculum was inserted and 6 mL of quarter percent Marcaine was infiltrated for an intracervical block. Cervix was grasped with single-tooth tenaculum. Cervix was dilated up to a #10 Hegar dilator. The VersaPoint was assembled and inserted with video camera and use and uterine fibroid arising from the posterior surface of the uterine cavity was seen as well as a second uterine fibroid at the fundus. The cutting loop was used with normal saline as the distending medium. Fragments of the fibroid were removed during the procedure. 9 mm suction curette was used to help remove fragments during the procedure. Each fibroid appeared to be about 3 cm. And eventually appeared to be good over 90% of the fibroids were resected. I then proceeded to removal of the one and half centimeter pedunculated skin tag on the left vulva. This was removed with scissors and a single suture with 4-0 Vicryl was placed and sterile dressing was applied. The patient tolerated procedure well without complications. She was brought in stable condition to the  recovery room.  Dr. Scheryl Darter 10/27/2011 12:30 PM ]

## 2011-10-29 NOTE — Anesthesia Postprocedure Evaluation (Signed)
  Anesthesia Post-op Note  Patient: Diana Lewis  Procedure(s) Performed: Procedure(s) (LRB): DILATATION & CURETTAGE/HYSTEROSCOPY WITH VERSAPOINT RESECTION (N/A)  Patient is awake and responsive. Pain and nausea are reasonably well controlled. Vital signs are stable and clinically acceptable. Oxygen saturation is clinically acceptable. There are no apparent anesthetic complications at this time. Patient is ready for discharge.

## 2011-11-01 ENCOUNTER — Inpatient Hospital Stay (HOSPITAL_COMMUNITY)
Admission: AD | Admit: 2011-11-01 | Discharge: 2011-11-02 | Disposition: A | Discharge status: Home / Self Care | Payer: Self-pay | Source: Ambulatory Visit | Attending: Family Medicine | Admitting: Family Medicine

## 2011-11-01 ENCOUNTER — Encounter (HOSPITAL_COMMUNITY): Payer: Self-pay | Admitting: *Deleted

## 2011-11-01 DIAGNOSIS — R197 Diarrhea, unspecified: Secondary | ICD-10-CM | POA: Insufficient documentation

## 2011-11-01 DIAGNOSIS — R109 Unspecified abdominal pain: Secondary | ICD-10-CM | POA: Insufficient documentation

## 2011-11-01 DIAGNOSIS — R112 Nausea with vomiting, unspecified: Secondary | ICD-10-CM

## 2011-11-01 DIAGNOSIS — R110 Nausea: Secondary | ICD-10-CM | POA: Insufficient documentation

## 2011-11-01 NOTE — MAU Note (Signed)
Pt states, " I had surgery Monday 7/8 for fibroids. This morning I started having diarrhea and this afternoon it became worse. Then I started having pain in my upper abdomen and then I started vomiting. After I vomited, then pain went down into my lower abdomen. I've had diarrhea 20 times or more and I have vomited one time but I am nauseated."

## 2011-11-02 LAB — URINALYSIS, ROUTINE W REFLEX MICROSCOPIC
Ketones, ur: NEGATIVE mg/dL
Leukocytes, UA: NEGATIVE
Nitrite: NEGATIVE
Specific Gravity, Urine: 1.025 (ref 1.005–1.030)
Urobilinogen, UA: 0.2 mg/dL (ref 0.0–1.0)
pH: 6 (ref 5.0–8.0)

## 2011-11-02 LAB — URINE MICROSCOPIC-ADD ON

## 2011-11-02 MED ORDER — ONDANSETRON 8 MG PO TBDP: 8.0000 mg | ORAL_TABLET | Freq: Three times a day (TID) | ORAL | Status: AC | PRN

## 2011-11-02 MED ORDER — KETOROLAC TROMETHAMINE 60 MG/2ML IM SOLN
60.0000 mg | Freq: Once | INTRAMUSCULAR | Status: AC
  Administered 2011-11-02: 60 mg via INTRAMUSCULAR
  Filled 2011-11-02: qty 2

## 2011-11-02 MED ORDER — ONDANSETRON 8 MG PO TBDP
8.0000 mg | ORAL_TABLET | Freq: Once | ORAL | Status: AC
  Administered 2011-11-02: 8 mg via ORAL
  Filled 2011-11-02: qty 1

## 2011-11-02 NOTE — MAU Provider Note (Signed)
Chart reviewed and agree with management and plan.  

## 2011-11-02 NOTE — MAU Provider Note (Signed)
History     CSN: [id number]  Arrival date and time: 11/01/11 2334   First Provider Initiated Contact with Patient 11/02/11 0026      Chief Complaint  Patient presents with  . Emesis  . Diarrhea  . Post-op Problem  . Abdominal Pain   HPI [facility] 46 y.o.  S/P hysteroscopy with removal of 2 fibroids on 10-27-11. Had coffee and cookies for breakfast.  Had diarrhea once then.   Ate at [facility] at lunch - had hash browns, beef and garlic bread.   Had several stools beginning at 5 pm and this evening the stools have been repetitive - green in color.  Took 2 pills of immodium but has not seen any change in the stools.  Having lower abdominal pain which has worsened.  No one else in the household has been sick.  OB History    Grav Para Term Preterm Abortions TAB SAB Ect Mult Living   3 1 1  0 2 0 2 0 0 1      Past Medical History  Diagnosis Date  . Anemia   . Metrorrhagia   . Fibroids   . Hypercholesterolemia     diet controlled cholesterol    Past Surgical History  Procedure Date  . Cholecystectomy   . Dilation and curettage of uterus     x 2  . Hyseroscopy   . Hysteroscopy October 27, 2011    Family History  Problem Relation Age of Onset  . Anesthesia problems Neg Hx   . Hyperlipidemia Mother     History  Substance Use Topics  . Smoking status: Never Smoker   . Smokeless tobacco: Never Used  . Alcohol Use: Yes     socially    Allergies: No Known Allergies  Prescriptions prior to admission  Medication Sig Dispense Refill  . ferrous sulfate 325 (65 FE) MG tablet Take 325 mg by mouth 3 (three) times daily.      Marland Kitchen HYDROcodone-acetaminophen (VICODIN) 5-500 MG per tablet Take 1-2 tablets by mouth every 4 (four) hours as needed for pain.  15 tablet  0  . Multiple Vitamins-Calcium (ONE-A-DAY WOMENS PO) Take 1 tablet by mouth daily.        Review of Systems  Constitutional: Negative for fever.  Gastrointestinal: Positive for nausea, vomiting, abdominal pain and  diarrhea.       Denies bloody stools  Genitourinary:       No vaginal discharge. No vaginal bleeding. No dysuria.   Physical Exam   Last menstrual period 09/13/2011.  Temp 98.5, pulse 88, BP 133/82  Physical Exam  Nursing note and vitals reviewed. Constitutional: She is oriented to person, place, and time. She appears well-developed and well-nourished. She appears distressed.  HENT:  Head: Normocephalic.  Eyes: EOM are normal.  Neck: Neck supple.  GI: Soft. There is tenderness. There is no rebound and no guarding.       Hyperactive bowel sounds in all quadrants.  Tenderness all across lower abdomen with palpation.  Musculoskeletal: Normal range of motion.  Neurological: She is alert and oriented to person, place, and time.  Skin: Skin is warm and dry.  Psychiatric: She has a normal mood and affect.    MAU Course  Procedures  MDM Will give Zofran 8 gm ODT sublingual now and Toradol 60 mg IM for pain. 0200  Feeling much better now. With Toradol and Zofran.  Not on antibiotics.  If diarrhea continues, may need to reevaluate and R/O c. Difficile  or other causes, but at this time would think it is unlikely.  Assessment and Plan  Diarrhea Nausea Abdominal pain  Plan rx Zofran 8 gm ODT one sublingual q 12 hours PRN Liquid diet tonight, advance to [employer], and advance further if symtoms do not worsen Return with bloody or black stools Return if your symptoms worsen.  BURLESON,TERRI 11/02/2011, 1:55 AM

## 2011-11-13 ENCOUNTER — Ambulatory Visit (INDEPENDENT_AMBULATORY_CARE_PROVIDER_SITE_OTHER): Payer: Self-pay | Admitting: Obstetrics & Gynecology

## 2011-11-13 ENCOUNTER — Encounter: Payer: Self-pay | Admitting: Obstetrics & Gynecology

## 2011-11-13 VITALS — BP 121/79 | HR 73 | Temp 97.1°F | Ht 59.5 in | Wt 134.7 lb

## 2011-11-13 DIAGNOSIS — Z09 Encounter for follow-up examination after completed treatment for conditions other than malignant neoplasm: Secondary | ICD-10-CM

## 2011-11-13 DIAGNOSIS — D259 Leiomyoma of uterus, unspecified: Secondary | ICD-10-CM

## 2011-11-13 DIAGNOSIS — N920 Excessive and frequent menstruation with regular cycle: Secondary | ICD-10-CM

## 2011-11-13 DIAGNOSIS — D219 Benign neoplasm of connective and other soft tissue, unspecified: Secondary | ICD-10-CM

## 2011-11-13 DIAGNOSIS — N921 Excessive and frequent menstruation with irregular cycle: Secondary | ICD-10-CM

## 2011-11-13 NOTE — Patient Instructions (Signed)
Menorragia (Menorrhagia) La hemorragia uterina (sangrado del tero) disfuncional es diferente del perodo menstrual normal. Cuando los perodos son irregulares o hay ms hemorragia que lo habitual en usted, se denomina menorragia. La causa puede ser un desequilibrio hormonal, fsico o metablico u otros problemas. Es necesario Medical sales representative examen para que el profesional pueda tratar [employer] causas que son tratables. Si el problema contina, ser necesario realizar una dilatacin y curetaje. Este procedimiento [employer] en dilatar el cuello del tero (la apertura del tero o matriz), es decir, se lo estira para Technical brewer, y se raspa la superficie que tapiza el interior del tero. Un especialista (patlogo) examina en el microscopio el tejido que se retira para asegurarse que no haya nada preocupante que requiera un examen ms exhaustivo. INSTRUCCIONES PARA EL CUIDADO DOMICILIARIO  Si el profesional que la asiste le prescribe medicamentos, tmelos tal como se le indic. No cambie ni reemplace medicamentos sin consultarlo con Mining engineer.   Las hemorragias de larga duracin pueden tener como consecuencia un dficit de hierro. El profesional que la asiste podr prescribirle comprimidos de hierro. Esto ayuda a Scientific laboratory technician que el organismo pierde luego de una hemorragia abundante. Tome los medicamentos tal como se le indic. El hierro puede causarle estreimiento. Si esto es un problema, aumente el consumo de [place], frutas y [place].   No tome aspirina o medicamentos que la contengan desde una semana antes del perodo menstrual ni durante el mismo. La aspirina puede hacer que la hemorragia empeore.   Si necesita cambiar el apsito o el tampn mas de una vez cada 2 horas, permanezca en cama y descanse todo lo posible hasta que la hemorragia se detenga.   Consuma alimentos balanceados. Coma alimentos ricos en hierro. Por ejemplo, verduras de [employer], carne, hgado, huevos y panes y Medical laboratory scientific officer de  grano integral. No trate de perder peso hasta que la hemorragia anormal se detenga y los niveles de hierro en la sangre vuelvan a la normalidad.  SOLICITE ATENCIN MDICA SI:  Debe cambiar el apsito o el tampn ms de una vez cada hora.   Si tiene nuseas (ganas de vomitar), vmitos, mareos o diarrea mientras toma los medicamentos.   Tiene algn problema que pueda relacionarse con el medicamento que est tomando.  SOLICITE ATENCIN MDICA DE INMEDIATO SI:  Tiene fiebre.   Siente escalofros.   Sufre una hemorragia intensa o comienza a eliminar cogulos de [place].   Se siente mareado o sufre un desmayo.  EST SEGURO QUE:   Comprende las instrucciones para el alta mdica.   Controlar su enfermedad.   Solicitar atencin mdica de inmediato segn las indicaciones.  Document Released: 01/15/2005 Document Revised: 03/27/2011 [facility] Patient Information 2012 [place], [place].

## 2011-11-13 NOTE — Progress Notes (Signed)
  Subjective:    Patient ID: Diana Lewis, female    DOB: November 15, 1965, 46 y.o.   MRN: [id number]  [id number] Patient's last menstrual period was 09/13/2011. S/P hysteroscopy, myomectomy and removal of vulvar skin lesion 10/27/11 no complications. Still with some vaginal bleeding, post op pain is resolved.     Review of Systems Vaginal bleeding, bloating    Objective:   Physical Exam  Constitutional: She appears well-developed. No distress.  Abdominal: Soft. She exhibits no distension and no mass. There is no tenderness.  Genitourinary: Vagina normal. Vaginal discharge: bleeding.       Uterus 6-8 weeks, no mass non-tender. Excision site healing well  Skin: Skin is warm and dry.  Psychiatric: She has a normal mood and affect.   Filed Vitals:   11/13/11 1043  BP: 121/79  Pulse: 73  Temp: 97.1 F (36.2 C)  TempSrc: Oral  Height: 4' 11.5" (1.511 m)  Weight: 134 lb 11.2 oz (61.1 kg)  NAD, pleasant         Assessment & Plan:  Bleeding post-operatively Normal exam RTC 3 month  Aayat Hajjar 11/13/2011 11:30 AM

## 2012-02-23 ENCOUNTER — Encounter: Payer: Self-pay | Admitting: Obstetrics & Gynecology

## 2012-02-23 ENCOUNTER — Ambulatory Visit (INDEPENDENT_AMBULATORY_CARE_PROVIDER_SITE_OTHER): Payer: Self-pay | Admitting: Obstetrics & Gynecology

## 2012-02-23 VITALS — BP 119/74 | HR 74 | Temp 97.7°F | Ht 60.0 in | Wt 136.2 lb

## 2012-02-23 DIAGNOSIS — D259 Leiomyoma of uterus, unspecified: Secondary | ICD-10-CM

## 2012-02-23 DIAGNOSIS — D219 Benign neoplasm of connective and other soft tissue, unspecified: Secondary | ICD-10-CM

## 2012-02-23 MED ORDER — NAPROXEN SODIUM 550 MG PO TABS: 550.0000 mg | ORAL_TABLET | Freq: Two times a day (BID) | ORAL | Status: DC

## 2012-02-23 NOTE — Patient Instructions (Signed)
Fibromas  (Fibroids)  Los fibromas son bultos (tumores) que pueden aparecer en cualquier lugar del cuerpo de una mujer. Estos tumores no son cancerosos. Pueden variar en tamaño, peso y lugar en el que crecen.  CUIDADOS EN EL HOGAR  · No tome aspirina.  · Anote el número de apósitos o tampones que usa durante el período. Infórmelo a su médico. Esto puede ayudar a determinar el mejor tratamiento para usted.  SOLICITE AYUDA DE INMEDIATO SI:  · Siente dolor en la zona inferior del vientre (abdomen) y no se alivia con analgésicos.  · Tiene cólicos que no se calman con medicamentos  · Aumenta el sangrado entre períodos o durante el mismo.  · Sufre mareos o se desvanece (se desmaya).  · El dolor en el vientre empeora.  ASEGÚRESE DE QUE:  · Comprende estas instrucciones.  · Controlará su enfermedad.  · Solicitará ayuda de inmediato si no mejora o empeora.  Document Released: 07/23/2010 Document Revised: 06/30/2011  ExitCare® Patient Information ©2013 ExitCare, LLC.

## 2012-02-24 NOTE — Progress Notes (Signed)
  Subjective:    Patient ID: Diana Lewis, female    DOB: Jun 02, 1965, 46 y.o.   MRN: [id number]  [id number] Patient's last menstrual period was 02/13/2012. Menses are normal, not heavy or painful. Occasional RLQ pain since surgery, not taking any pain medications.    Review of Systems No discharge, bleeding, dysuria. Pain is positional, worse with activities.     Objective:   Physical Exam  Constitutional: She is oriented to person, place, and time. She appears well-developed. No distress.  Pulmonary/Chest: Effort normal. No respiratory distress.  Abdominal: Soft. She exhibits no mass. There is Tenderness: mild RLQ tenderness.. There is no rebound and no guarding.  Genitourinary: Vagina normal and uterus normal.       No mass  Neurological: She is alert and oriented to person, place, and time.  Skin: Skin is warm and dry.  Psychiatric: She has a normal mood and affect. Her behavior is normal.   Filed Vitals:   02/23/12 1431  BP: 119/74  Pulse: 74  Temp: 97.7 F (36.5 C)           Assessment & Plan:  Doing well postoperative from myomectomy except pain with seems to be musculoskeletal   Report if no improving. Naprosyn 550 mg BID for 7-10 days then prn.  Mordechai Matuszak

## 2013-11-12 IMAGING — US US PELVIS COMPLETE
1 series · 13 of 25 positions shown · non-contrast
Comparison: 09/03/2010.

CLINICAL DATA: Vaginal bleeding and pelvic pain for the past 2
months.  Right adnexal tenderness.  Known uterine fibroids.

TRANSABDOMINAL AND TRANSVAGINAL ULTRASOUND OF PELVIS
TECHNIQUE: Both transabdominal and transvaginal ultrasound
examinations of the pelvis were performed. Transabdominal technique
was performed for global imaging of the pelvis including uterus,
ovaries, adnexal regions, and pelvic cul-de-sac.

[Series 1: us transvaginal non-ob · 13 of 40 slices shown]
[im 1/40]
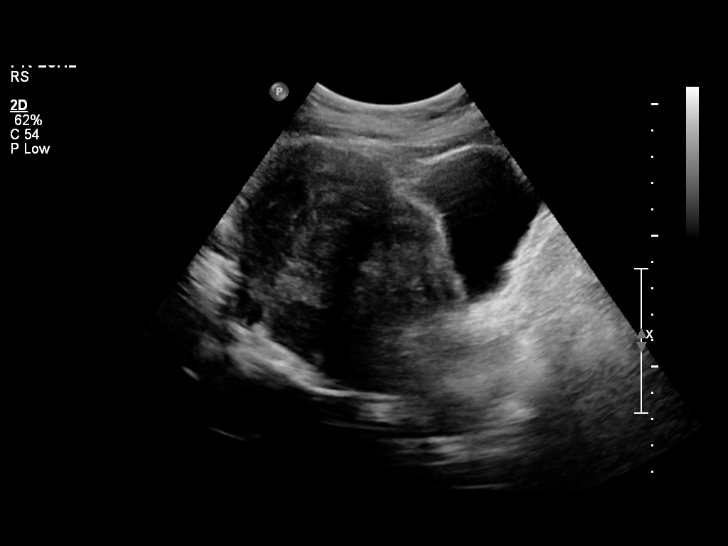
[im 4/40]
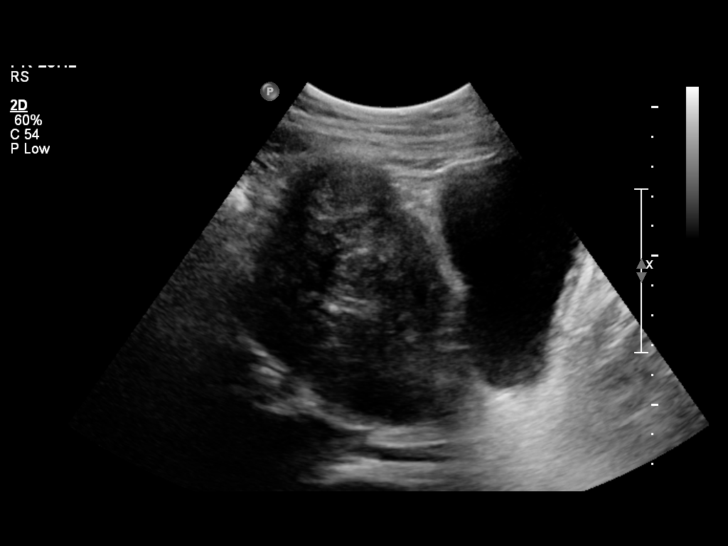
[im 7/40]
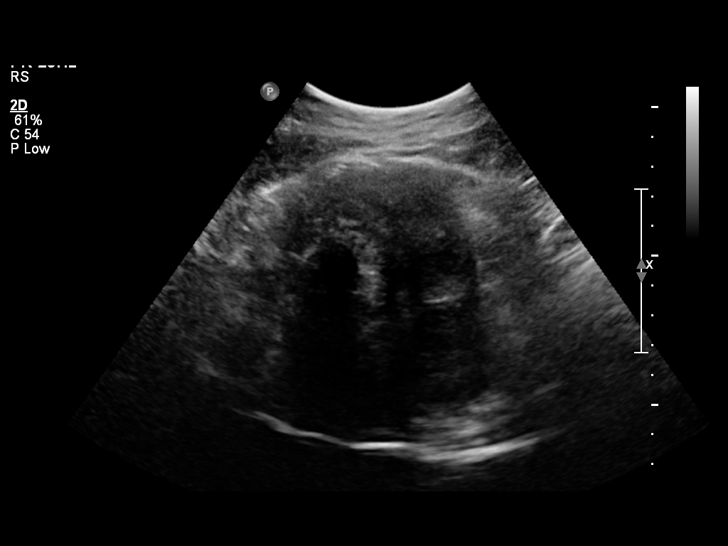
[im 10/40]
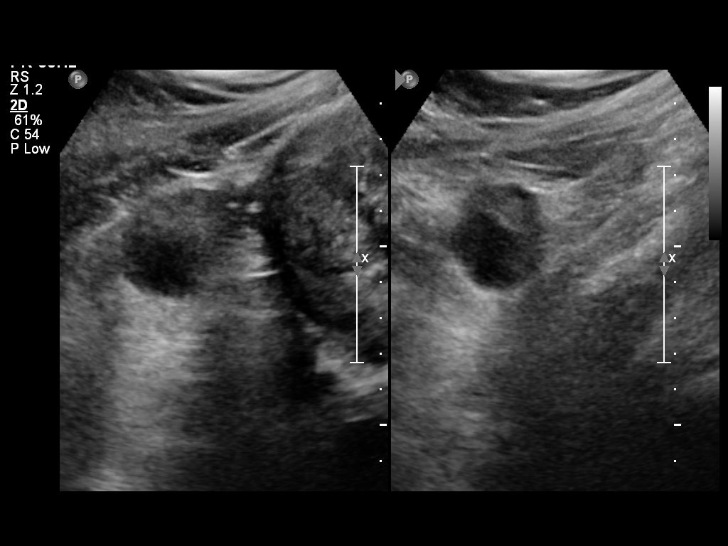
[im 14/40]
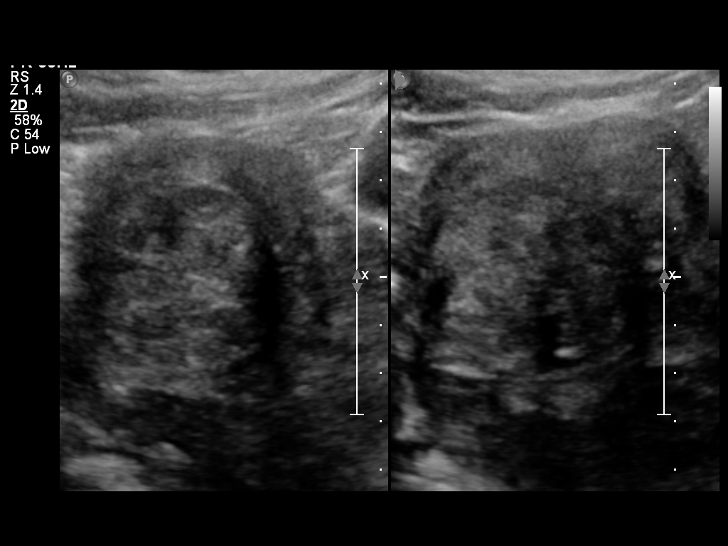
[im 17/40]
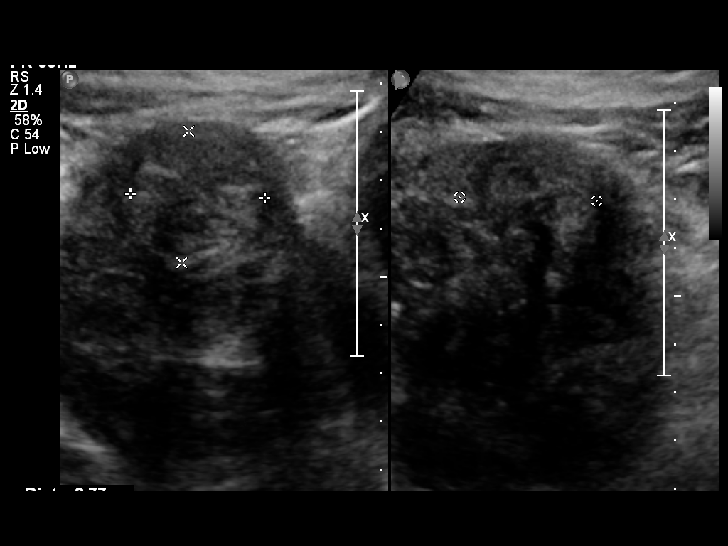
[im 20/40]
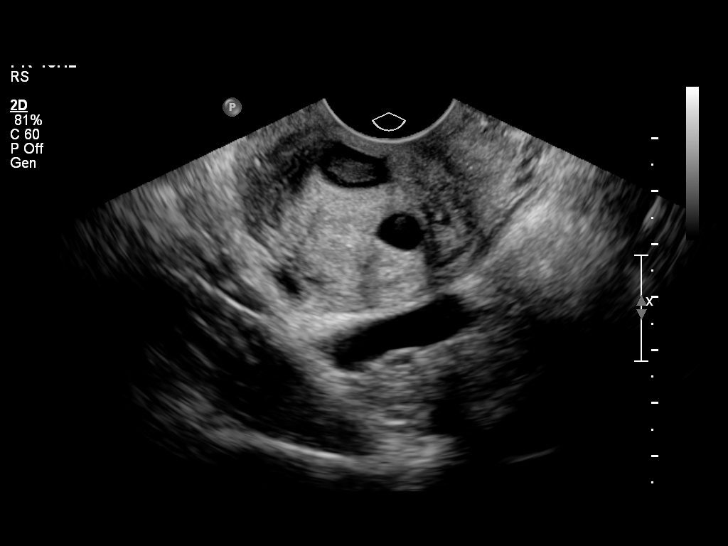
[im 23/40]
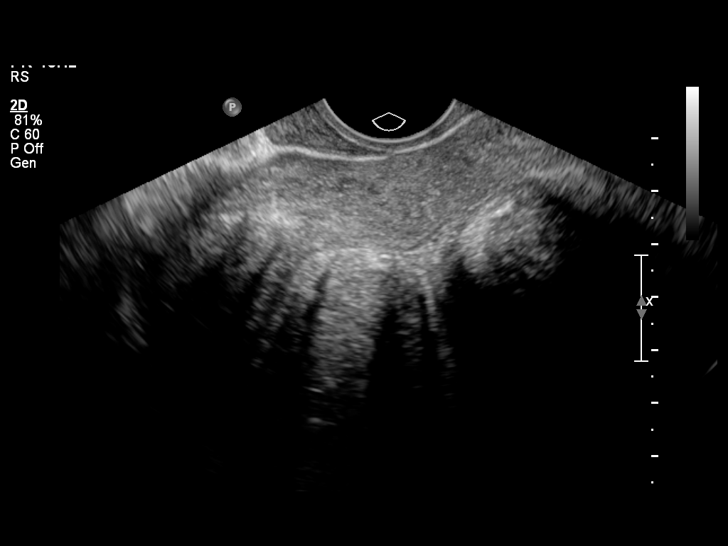
[im 27/40]
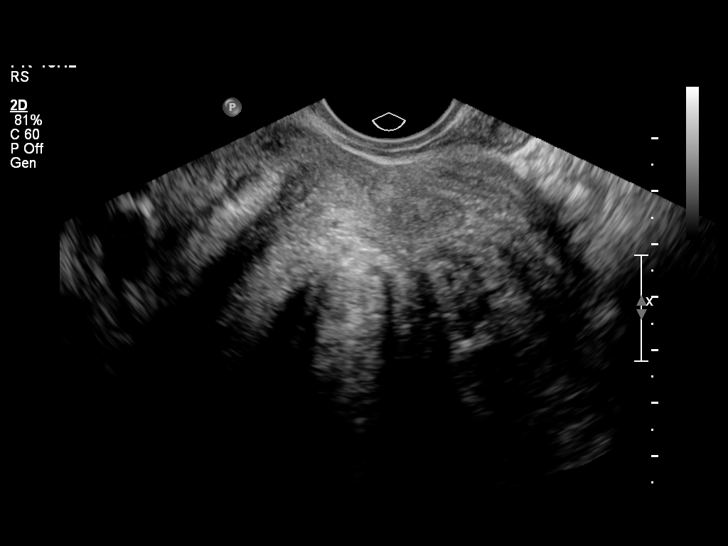
[im 30/40]
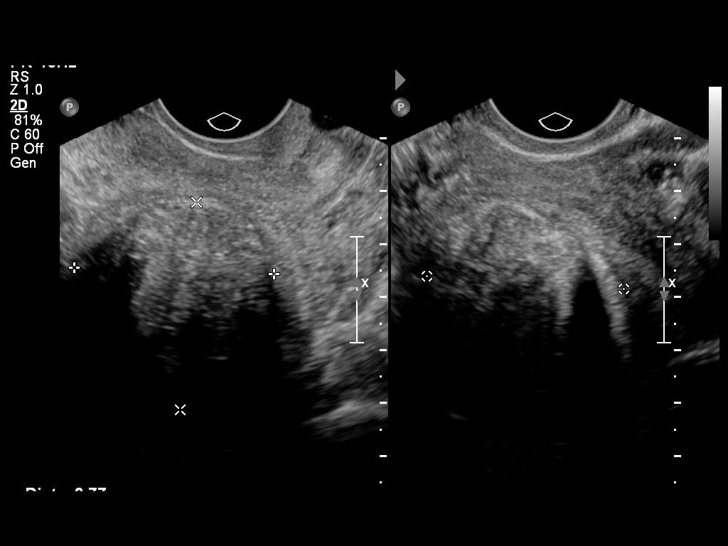
[im 33/40]
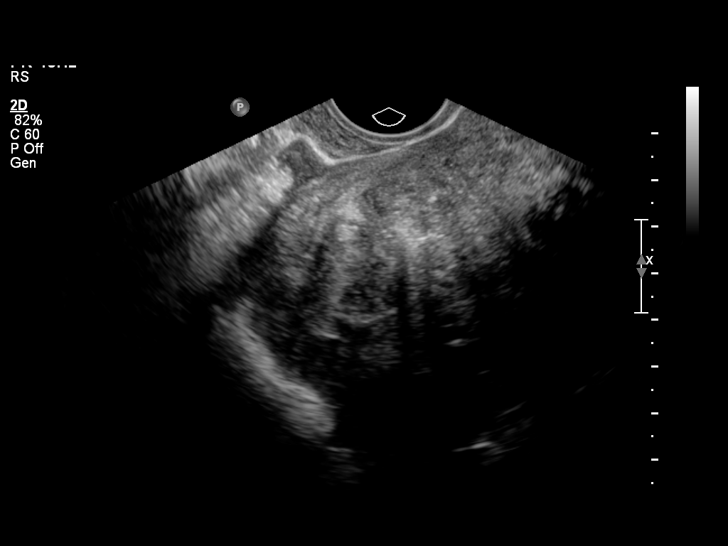
[im 36/40]
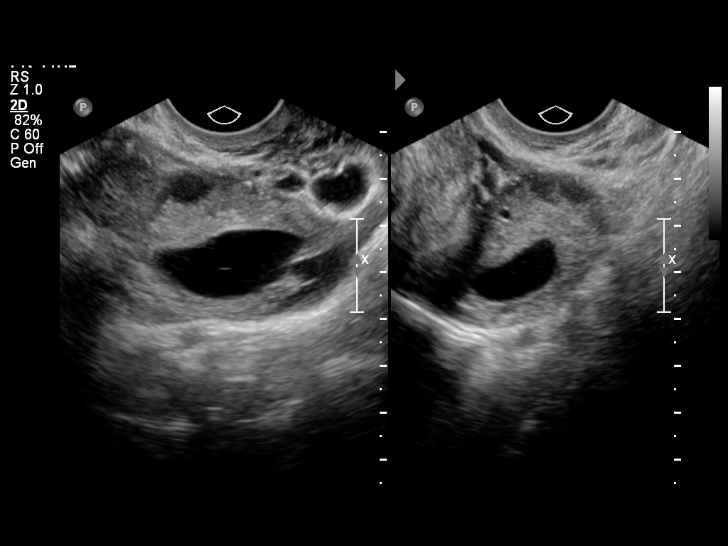
[im 40/40]
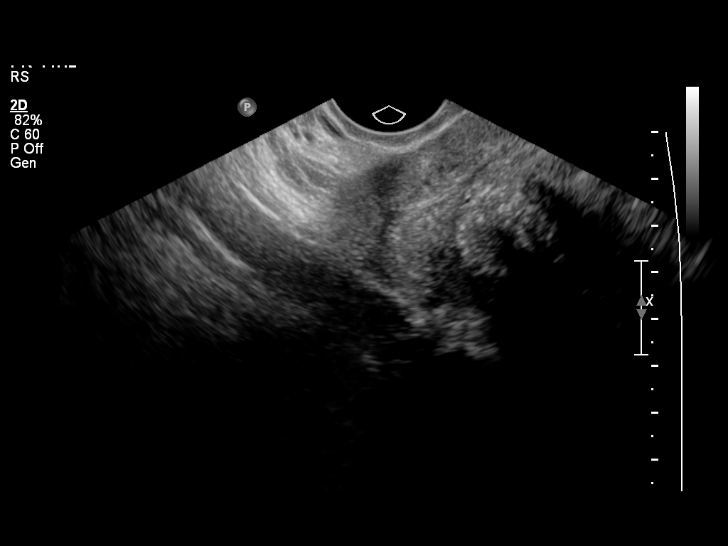

[13 of 25 positions shown; findings below may reference images not displayed]

It was necessary to proceed with endovaginal exam following the
transabdominal exam to visualize the uterus and ovaries in better
detail..
FINDINGS: Uterus: Enlarged and containing multiple masses.  The uterus
measures 12.8 x 7.4 x 7.1 cm.  Two dominant masses are again
demonstrated.  One measures 3.8 x 3.9 x 3.7 cm and the other
measures 3.8 x 3.6 x 3.4 cm.  These were previously shown to have
submucosal components.

Endometrium: Not visualized due to the uterine masses obscuring the
endometrium.

Right ovary:  Normal appearance/no adnexal mass

Left ovary: Normal appearance/no adnexal mass

Other findings: No free fluid
IMPRESSION: Little change in previously demonstrated uterine fibroids.  These
are obscuring the endometrium.  No acute abnormality..

## 2014-02-20 ENCOUNTER — Encounter: Payer: Self-pay | Admitting: Obstetrics & Gynecology

## 2014-07-10 ENCOUNTER — Ambulatory Visit: Payer: Self-pay | Admitting: Obstetrics & Gynecology

## 2014-08-04 ENCOUNTER — Encounter: Payer: Self-pay | Admitting: Obstetrics & Gynecology

## 2014-08-04 ENCOUNTER — Ambulatory Visit (INDEPENDENT_AMBULATORY_CARE_PROVIDER_SITE_OTHER): Payer: Self-pay | Admitting: Obstetrics & Gynecology

## 2014-08-04 ENCOUNTER — Ambulatory Visit: Payer: Self-pay | Admitting: Obstetrics and Gynecology

## 2014-08-04 VITALS — BP 121/72 | HR 78 | Temp 98.3°F | Ht 59.0 in | Wt 129.3 lb

## 2014-08-04 DIAGNOSIS — Z1151 Encounter for screening for human papillomavirus (HPV): Secondary | ICD-10-CM

## 2014-08-04 DIAGNOSIS — Z0000 Encounter for general adult medical examination without abnormal findings: Secondary | ICD-10-CM

## 2014-08-04 DIAGNOSIS — D259 Leiomyoma of uterus, unspecified: Secondary | ICD-10-CM

## 2014-08-04 DIAGNOSIS — Z3202 Encounter for pregnancy test, result negative: Secondary | ICD-10-CM

## 2014-08-04 DIAGNOSIS — N921 Excessive and frequent menstruation with irregular cycle: Secondary | ICD-10-CM

## 2014-08-04 DIAGNOSIS — Z118 Encounter for screening for other infectious and parasitic diseases: Secondary | ICD-10-CM

## 2014-08-04 DIAGNOSIS — Z113 Encounter for screening for infections with a predominantly sexual mode of transmission: Secondary | ICD-10-CM

## 2014-08-04 DIAGNOSIS — Z124 Encounter for screening for malignant neoplasm of cervix: Secondary | ICD-10-CM

## 2014-08-04 LAB — CBC
HCT: 35.3 % — ABNORMAL LOW (ref 36.0–46.0)
Hemoglobin: 11.5 g/dL — ABNORMAL LOW (ref 12.0–15.0)
MCH: 27.8 pg (ref 26.0–34.0)
MCHC: 32.6 g/dL (ref 30.0–36.0)
MCV: 85.5 fL (ref 78.0–100.0)
MPV: 9.5 fL (ref 8.6–12.4)
PLATELETS: 340 10*3/uL (ref 150–400)
RBC: 4.13 MIL/uL (ref 3.87–5.11)
RDW: 14.8 % (ref 11.5–15.5)
WBC: 5.4 10*3/uL (ref 4.0–10.5)

## 2014-08-04 LAB — POCT PREGNANCY, URINE: Preg Test, Ur: NEGATIVE

## 2014-08-04 LAB — TSH: TSH: 1.595 u[IU]/mL (ref 0.350–4.500)

## 2014-08-04 MED ORDER — MEGESTROL ACETATE 20 MG PO TABS: 40.0000 mg | ORAL_TABLET | Freq: Every day | ORAL | Status: DC

## 2014-08-04 MED ORDER — NORGESTIMATE-ETH ESTRADIOL 0.25-35 MG-MCG PO TABS
1.0000 | ORAL_TABLET | Freq: Every day | ORAL | Status: DC
Start: 2014-08-04 — End: 2015-05-11

## 2014-08-04 NOTE — Patient Instructions (Addendum)
Fibroma uterino (Uterine Fibroid) Un fibroma uterino es un crecimiento (tumor) dentro del tero. Este tipo de tumor no es Radio broadcast assistant y no se extiende fuera del tero. Podr tener uno o varios fibromas. Los fibromas pueden variar en tamao, peso y TEFL teacher en que se desarrollan dentro del tero. Algunos pueden llegar a ser bastante grandes. La mayora de los fibromas no necesitan tratamiento mdico, pero algunos pueden causar dolor o sangrado abundante durante los perodos y [place]. CAUSAS  Un fibroma es el resultado del desarrollo continuo de una nica clula uterina que sigue creciendo (no regulada) que es diferente al resto de las clulas del cuerpo humano. La mayora de las clulas tiene un mecanismo de control que evita que se reproduzcan de Research officer, trade union.  SIGNOS Y SNTOMAS   Hemorragias.  Dolor y sensacin de presin en la pelvis.  Problemas en la vejiga debido al tamao del fibroma.  Infertilidad y abortos espontneos, segn el tamao y la ubicacin del fibroma. DIAGNSTICO  Los fibromas uterinos se diagnostican con un examen fsico. El mdico puede palpar los tumores abultados al realizar el examen de la pelvis. Una ecografa puede indicarse para tener informacin del tamao, la ubicacin y el nmero de tumores.  TRATAMIENTO   El mdico puede considerar que es conveniente esperar y Barrister's clerk. Esto incluye el control del fibroma por parte del mdico para observar si crece o disminuye su tamao.  Podr indicarle un tratamiento hormonal o el uso de un dispositivo intrauterino (DIU).  En algunos casos es necesaria la ciruga para extirpar el fibroma (miomectoma) o el tero (histerectoma). Esto depender de su situacin. Cuando una mujer desea quedar embarazada y los fibromas interfieren en su fertilidad, el mdico puede recomendar la extirpacin del fibroma.  INSTRUCCIONES PARA EL CUIDADO EN EL HOGAR  Los cuidados en el hogar dependen del tratamiento que haya  recibido. En general:   Cumpla con todas las visitas de control, segn le indique su mdico.  Tome slo medicamentos de venta libre o recetados, segn las indicaciones del mdico. Si le recetaron un tratamiento hormonal, tome los medicamentos hormonales como le indicaron. No tome aspirina. Puede ocasionar hemorragias.  Consulte al mdico si debe tomar pldoras de hierro.  Si sus perodos son molestos pero no tan abundantes, acustese con los pies ligeramente elevados por encima del nivel del corazn. Coloque compresas fras en la zona inferior del abdomen.  Si sus perodos son muy abundantes, anote el nmero de compresas o tampones que [place] cada mes. Lleve esta informacin a su consulta mdica.  Incluya vegetales verdes en su dieta. SOLICITE ATENCIN MDICA DE INMEDIATO SI:  Siente dolor o clicos en la pelvis y no puede controlarlos con los medicamentos.  El dolor en la pelvis aumenta de manera repentina.  Aumenta el sangrado entre los perodos o [employer].  Si tiene perodos muy abundantes y debe cambiar un tampn o una toalla higinica cada media hora o menos.  Se siente mareado o tiene episodios de [place]. Document Released: 04/07/2005 Document Revised: 01/26/2013 [facility] Patient Information 2015 [place], [place]. This information is not intended to replace advice given to you by your health care provider. Make sure you discuss any questions you have with your health care provider. Uso de los Electronics engineer (Oral Contraception Use) Los anticonceptivos orales (ACO) son medicamentos que se utilizan para Therapist, occupational. Su funcin es [employer] ovarios liberen vulos. Las hormonas de los ACO tambin hacen que el moco cervical se haga ms espeso,  lo que evita que el esperma ingrese al tero. Tambin hacen que la membrana que recubre internamente al tero se vuelva ms fina, lo que no permite que el huevo fertilizado se adhiera a la pared del tero. Los ACO son muy  efectivos cuando se toman exactamente como se prescriben. Sin embargo, no previenen contra las enfermedades de transmisin sexual (ETS). La prctica del sexo seguro, como el uso de preservativos, junto con los ACO, [place] a prevenir ese tipo de enfermedades. Antes de tomar ACO, debe hacerse un examen fsico y un [place]. El mdico podr indicarle anlisis de [place], si es necesario. El mdico se asegurar de que usted sea [place] buena candidata para usar anticonceptivos orales. Converse con su mdico acerca de los posibles efectos secundarios de los ACO que podran recetarle. Cuando se inicia el uso de ACO, se pueden tomar durante 2 a 3 meses para que el cuerpo se adapte a los cambios en los niveles hormonales en el cuerpo.  CMO TOMAR LOS ANTICONCEPTIVOS ORALES El mdico le indicar como comenzar a Holiday representative de ACO. De lo contrario usted puede:   Journalist, newspaper de inicio del ciclo menstrual. No necesitar proteccin anticonceptiva adicional al Engineer, manufacturing.   Comenzar Software engineer domingo luego de su perodo menstrual, o Games developer en que adquiere el [employer]. En estos casos deber [employer]na proteccin anticonceptiva [employer] primeros 7 das del [place].   Comenzar a tomarlos en cualquier momento del ciclo. Si toma el anticonceptivo dentro de los 5 das de iniciado el perodo, Personnel officer protegida de quedar embarazada inmediatamente. En este caso, no necesitar una forma adicional de anticonceptivos. Si comienza en cualquier otro momento del ciclo menstrual, necesitar usar otra forma de anticonceptivo durante 7 das. Si sus ACO son del tipo de los [employer], podrn impedir el embarazo despus de tomarlas por 2 das (48 horas). Luego de comenzar a tomar los ACO:   Si olvid de tomar 1 pldora, tmela tan pronto como lo recuerde. Tome la siguiente pldora a la hora habitual.   Si dej de tomar 2 o ms pldoras, comunquese con su mdico ya que diferentes pldoras  tienen diferentes instrucciones para las dosis que no se han tomado. Si olvida tomar 2 o ms pldoras, utilice un mtodo anticonceptivo adicional hasta que comience su prximo perodo menstrual.   Si utiliza el envase de 28 pldoras que contienen pldoras inactivas y [place] tomar 1 de las ltimas 7 (pldoras sin hormonas), sto no tiene Education administrator. Simplemente deseche el resto de las pldoras que no contienen hormonas y comience un nuevo envase.  No importa cuando comience a tomar los anticonceptivos, siempre empiece un nuevo envase el mismo da de la [place]. Tenga un envase extra de ACO y use un mtodo anticonceptivo adicional para Education officer, museum en que se olvide de tomar algunas pldoras o pierda la caja.  INSTRUCCIONES PARA EL CUIDADO EN EL HOGAR   No fume.   Use siempre un condn para protegerse contra las enfermedades de transmisin sexual. Los ACO no protegen contra las enfermedades de transmisin sexual.   Use un almanaque para [employer] de su perodo menstrual.   Lea la informacin y consejos que vienen con las ACO. Hable con el profesional si tiene dudas.  SOLICITE ATENCIN MDICA SI:   Presenta nuseas o vmitos.   Tiene flujo o sangrado vaginal anormal.   Aparece una erupcin cutnea.   No tiene el perodo menstrual.   Pierde el cabello.  Necesita tratamiento por cambios en su estado de nimo o por depresin.   Se siente mareada al [employer].   Comienza a aparecer acn con el uso de los ACO.   Thyra Breed.  SOLICITE ATENCIN MDICA DE INMEDIATO SI:   Siente dolor en el pecho.   Le falta el aire.   Le duele mucho la cabeza y no puede Financial controller.   Siente adormecimiento o tiene dificultad para hablar.   Tiene problemas de visin.   Presenta dolor, inflamacin o hinchazn en las piernas.  Document Released: 03/27/2011 Document Revised: 12/08/2012 [facility] Patient Information 2015 [place]. This information is not  intended to replace advice given to you by your health care provider. Make sure you discuss any questions you have with your health care provider.

## 2014-08-04 NOTE — Progress Notes (Signed)
Subjective:     Patient ID: Diana Lewis, female   DOB: 1965/07/13, 49 y.o.   MRN: [id number]  HPI Pt [id number] s/p SVD x1 reports that she is having heavy bleeding.  She has a h/o a hysteroscopic myomectomy in 2013.  She reports that after the surgery the bleeding improved but, she continued to have 3 days of very heavy bleeding. PRIOR to the surgery she was bleeding for 1 full year with painful menses.  The pain is now during menses only.   The pt reports that it has been many years since her last PAP.  She does not smoke or have any other bleeding issues.     Past Medical History  Diagnosis Date  . Anemia   . Metrorrhagia   . Fibroids   . Hypercholesterolemia     diet controlled cholesterol   Past Surgical History  Procedure Laterality Date  . Cholecystectomy    . Dilation and curettage of uterus      x 2  . Hyseroscopy    . Hysteroscopy  October 27, 2011   Current Outpatient Prescriptions on File Prior to Visit  Medication Sig Dispense Refill  . ferrous sulfate 325 (65 FE) MG tablet Take 325 mg by mouth 3 (three) times daily.    . Multiple Vitamins-Calcium (ONE-A-DAY WOMENS PO) Take 1 tablet by mouth daily.     No current facility-administered medications on file prior to visit.   History   Social History  . Marital Status: Single    Spouse Name: N/A  . Number of Children: N/A  . Years of Education: N/A   Occupational History  . Not on file.   Social History Main Topics  . Smoking status: Never Smoker   . Smokeless tobacco: Never Used  . Alcohol Use: Yes     Comment: socially  . Drug Use: No  . Sexual Activity: Yes    Birth Control/ Protection: Condom   Other Topics Concern  . Not on file   Social History Narrative      Review of Systems     Objective:   Physical Exam BP 121/72 mmHg  Pulse 78  Temp(Src) 98.3 F (36.8 C)  Ht 4\' 11"  (1.499 m)  Wt 129 lb 4.8 oz (58.65 kg)  BMI 26.10 kg/m2  LMP 07/04/2014  Abd: soft, NT; ND  GU: EGBUS: no  lesions Vagina: + blood in vault Cervix: no lesion; no mucopurulent d/c; no CMT - PAP obtained Uterus: enlarged ~10-12 weeks sized, mobile Adnexa: no masses; non tender    ;      Assessment:     Menorrhagia- sx most likely due to the uterine fibroids.  I have reviewed with her treatment options including TVH, [place] and medications.  Pt reports that she just started a new job and is worried about losing more time from work.  She would like to attempt medical management at this time.  She has no contraindications to OCPs        Plan:     Sprintec 1 po daily Labs: CBC and TSH Pelvic sono F/u PAP and cx

## 2014-08-04 NOTE — Progress Notes (Signed)
Used Interpreter Avelino Leeds. States had myomectomy 10/2011. States  having heavy and painful  periods . Stated fainted in December once, not sure why.

## 2014-08-08 LAB — CYTOLOGY - PAP

## 2014-08-10 ENCOUNTER — Ambulatory Visit (HOSPITAL_COMMUNITY)
Admission: RE | Admit: 2014-08-10 | Discharge: 2014-08-10 | Disposition: A | Discharge status: Home / Self Care | Payer: Self-pay | Source: Ambulatory Visit | Attending: Obstetrics & Gynecology | Admitting: Obstetrics & Gynecology

## 2014-08-10 ENCOUNTER — Telehealth: Payer: Self-pay | Admitting: *Deleted

## 2014-08-10 DIAGNOSIS — D259 Leiomyoma of uterus, unspecified: Secondary | ICD-10-CM | POA: Insufficient documentation

## 2014-08-10 DIAGNOSIS — N921 Excessive and frequent menstruation with irregular cycle: Secondary | ICD-10-CM | POA: Insufficient documentation

## 2014-08-10 NOTE — Telephone Encounter (Signed)
-----   Message from Lavonia Drafts, MD sent at 08/10/2014 10:21 AM EDT ----- Please call pt. Need Spanish interpreter:  Her TSH is normal Her Hgb is a little low- begin FeSO4 bid Her sono shows fibroids as expected.  Keep the [place] and f/u in 3-6 months or sooner if she decided that the OCPs are not working or  she needs to schedule surgery before that time.  Thx, clh-S

## 2014-08-10 NOTE — Telephone Encounter (Signed)
Contacted patient with Spanish Interpreter Zenda Alpers. Results given, pt verbalizes understanding and has no further questions

## 2015-05-11 ENCOUNTER — Encounter: Payer: Self-pay | Admitting: Obstetrics & Gynecology

## 2015-05-11 ENCOUNTER — Ambulatory Visit (INDEPENDENT_AMBULATORY_CARE_PROVIDER_SITE_OTHER): Payer: Self-pay | Admitting: Obstetrics & Gynecology

## 2015-05-11 VITALS — BP 127/81 | HR 70 | Temp 98.5°F | Wt 138.0 lb

## 2015-05-11 DIAGNOSIS — N921 Excessive and frequent menstruation with irregular cycle: Secondary | ICD-10-CM

## 2015-05-11 DIAGNOSIS — D259 Leiomyoma of uterus, unspecified: Secondary | ICD-10-CM

## 2015-05-11 LAB — CBC
HCT: 39.5 % (ref 36.0–46.0)
HEMOGLOBIN: 12.9 g/dL (ref 12.0–15.0)
MCH: 30.3 pg (ref 26.0–34.0)
MCHC: 32.7 g/dL (ref 30.0–36.0)
MCV: 92.7 fL (ref 78.0–100.0)
MPV: 9.5 fL (ref 8.6–12.4)
PLATELETS: 332 10*3/uL (ref 150–400)
RBC: 4.26 MIL/uL (ref 3.87–5.11)
RDW: 13.8 % (ref 11.5–15.5)
WBC: 6.6 10*3/uL (ref 4.0–10.5)

## 2015-05-11 MED ORDER — NORGESTIMATE-ETH ESTRADIOL 0.25-35 MG-MCG PO TABS: 1.0000 | ORAL_TABLET | Freq: Every day | ORAL | Status: DC

## 2015-05-11 NOTE — Progress Notes (Signed)
Patient ID: Diana Lewis, female   DOB: 09-03-1965, 50 y.o.   MRN: [id number] History:  50 y.o. [id number] here today for reeval of menorrhagia and uterine fibroids.  Pt taking OCPs with better control of the bleedign but, she c/o pain with cycles only especially with heavy bleeding.  Past Medical History  Diagnosis Date  . Anemia   . Metrorrhagia   . Fibroids   . Hypercholesterolemia     diet controlled cholesterol   Past Surgical History  Procedure Laterality Date  . Cholecystectomy    . Dilation and curettage of uterus      x 2  . Hyseroscopy    . Hysteroscopy  October 27, 2011   Current Outpatient Prescriptions on File Prior to Visit  Medication Sig Dispense Refill  . ibuprofen (ADVIL,MOTRIN) 800 MG tablet Take 800 mg by mouth every 8 (eight) hours as needed.    . Multiple Vitamins-Calcium (ONE-A-DAY WOMENS PO) Take 1 tablet by mouth daily.    Marland Kitchen OVER THE COUNTER MEDICATION 1 tablet as needed. Over the counter menstrual pain med    . ferrous sulfate 325 (65 FE) MG tablet Take 325 mg by mouth 3 (three) times daily.     No current facility-administered medications on file prior to visit.   No Known Allergies  The following portions of the patient's history were reviewed and updated as appropriate: allergies, current medications, past family history, past medical history, past social history, past surgical history and problem list.  Review of Systems:  Pertinent items are noted in HPI.  Objective:  Physical Exam: BP 127/81 mmHg  Pulse 70  Temp(Src) 98.5 F (36.9 C)  Wt 138 lb (62.596 kg)   Gen: NAD Abd: Soft, nontender and nondistended Pelvic: Normal appearing external genitalia; normal appearing vaginal mucosa and cervix.  Lots of blood in vault.  Uterus enlarged and irregular in contour, no other palpable masses, no uterine or adnexal tenderness  Labs and Imaging 08/10/2014 CLINICAL DATA: Patient with menorrhagia and abnormal uterine bleeding. Prior myomectomy  2013.  EXAM: TRANSABDOMINAL AND TRANSVAGINAL ULTRASOUND OF PELVIS  TECHNIQUE: Both transabdominal and transvaginal ultrasound examinations of the pelvis were performed. Transabdominal technique was performed for global imaging of the pelvis including uterus, ovaries, adnexal regions, and pelvic cul-de-sac. It was necessary to proceed with endovaginal exam following the transabdominal exam to visualize the endometrium.  COMPARISON: Pelvic ultrasound 06/11/2011  FINDINGS: Uterus  Measurements: 11.5 x 6.7 x 8.3 cm. Innumerable fibroids are demonstrated. There is a 3.3 x 3.2 x 2.5 cm submucosal fibroid within the posterior uterine body. There is an additional 2.0 x 2.0 x 2.4 cm submucosal fibroid within the posterior aspect of the uterine fundus. There is a 3.2 x 2.1 x 1.7 cm intramural fibroid within the left uterine body. Multiple additional smaller fibroids are demonstrated. Fibroids are demonstrated throughout the uterus.  Endometrium  Distorted due to multiple fibroids. Not well measured. Small amount of endometrial fluid.  Right ovary  Measurements: 2.5 x 1.4 x 1.5 cm. Normal appearance/no adnexal mass.  Left ovary  Measurements: 2.1 x 1.5 x 1.8 cm. Normal appearance/no adnexal mass.  Other findings  No free fluid.  IMPRESSION: Multiple uterine fibroids as above. A few of these fibroids have a submucosal component.  The endometrium is not well visualized and is distorted due to the multiple uterine fibroids. Small amount of endometrial fluid.   Assessment & Plan:  Menorrhagia and pelvic pain thought to be due to uterine fibroids.  Sx improved with OCPs  but, sx still not satisfactory for pt.  CBC Keep OCPs  Patient desires surgical management with total vaginal hysterectomy with bilateral salpingectomy.   The risks of surgery were discussed in detail with the patient including but not limited to: bleeding which may require transfusion or  reoperation; infection which may require prolonged hospitalization or re-hospitalization and antibiotic therapy; injury to bowel, bladder, ureters and major vessels or other surrounding organs; need for additional procedures including laparotomy; thromboembolic phenomenon, incisional problems and other postoperative or anesthesia complications.  Patient was told that the likelihood that her condition and symptoms will be treated effectively with this surgical management was very high; the postoperative expectations were also discussed in detail. The patient also understands the alternative treatment options which were discussed in full. All questions were answered.  She was told that she will be contacted by our surgical scheduler regarding the time and date of her surgery; routine preoperative instructions of having nothing to eat or drink after midnight on the day prior to surgery and also coming to the hospital 1 1/2 hours prior to her time of surgery were also emphasized.  She was told she may be called for a preoperative appointment about a week prior to surgery and will be given further preoperative instructions at that visit. Printed patient education handouts about the procedure were given to the patient to review at home.  Samai Corea L. Harraway-Smith, M.D., Cherlynn June

## 2015-05-23 ENCOUNTER — Encounter (HOSPITAL_COMMUNITY): Payer: Self-pay | Admitting: *Deleted

## 2015-07-17 ENCOUNTER — Encounter (HOSPITAL_COMMUNITY): Payer: Self-pay

## 2015-07-17 ENCOUNTER — Encounter (HOSPITAL_COMMUNITY)
Admission: RE | Admit: 2015-07-17 | Discharge: 2015-07-17 | Disposition: A | Discharge status: Home / Self Care | Payer: Self-pay | Source: Ambulatory Visit | Attending: Obstetrics & Gynecology | Admitting: Obstetrics & Gynecology

## 2015-07-17 DIAGNOSIS — Z01812 Encounter for preprocedural laboratory examination: Secondary | ICD-10-CM | POA: Insufficient documentation

## 2015-07-17 HISTORY — DX: Gastro-esophageal reflux disease without esophagitis: K21.9

## 2015-07-17 HISTORY — DX: Major depressive disorder, single episode, unspecified: F32.9

## 2015-07-17 HISTORY — DX: Myalgia, unspecified site: M79.10

## 2015-07-17 HISTORY — DX: Pain in unspecified hand: M79.643

## 2015-07-17 HISTORY — DX: Depression, unspecified: F32.A

## 2015-07-17 HISTORY — DX: Inflammatory liver disease, unspecified: K75.9

## 2015-07-17 LAB — CBC
HCT: 38.1 % (ref 36.0–46.0)
HEMOGLOBIN: 12.8 g/dL (ref 12.0–15.0)
MCH: 30.1 pg (ref 26.0–34.0)
MCHC: 33.6 g/dL (ref 30.0–36.0)
MCV: 89.6 fL (ref 78.0–100.0)
Platelets: 312 10*3/uL (ref 150–400)
RBC: 4.25 MIL/uL (ref 3.87–5.11)
RDW: 12.8 % (ref 11.5–15.5)
WBC: 7.5 10*3/uL (ref 4.0–10.5)

## 2015-07-17 LAB — TYPE AND SCREEN
ABO/RH(D): O POS
ANTIBODY SCREEN: NEGATIVE

## 2015-07-17 LAB — ABO/RH: ABO/RH(D): O POS

## 2015-07-17 NOTE — Patient Instructions (Addendum)
Instrucciones:  Su cirugia esta programada para-( your procedure is scheduled on) : Tuesday, July 24, 2015   Entre por la entrada principal a la(s) -(enter through the main entrance at): 10:30 AM  [place] e informenos de su llegada ( pick up phone, dial [phone] on arrival)  Por favor llame al [phone] si tiene algun problema la Diana Lewis ( please call [phone] if you have any problems the morning of surgery.)  Recuerde: (Remember)  No coma alimentos ni tome liquidos, incluyendo agua, despues de la medianoche del  ( Do not eat food or drink liquids including water after midnight on After midnight Monday, July 23, 2015  Tome estas medicinas la [place] de la cirugia con un sorbito de agua (take these meds the morning of surgery with a SIP of water) None  Puede cepillarse los dientes en la manana de la [place]. (you may brush your teeth the morning of surgery)  NO use joyas, maquillaje de ojos, lapiz labial, crema para el cuerpo o esmalte de unas oscuro - las unas de los pies pueden estar pintados. ( Do not wear jewelry, eye makeup, lipstick, body lotion, or dark fingernail polish)  Puede usar desodorante ( you may wear deodorant)  Si va a ser ingresado despues de las [place], deje la [place] en el carro hasta que se le haya asignado una habitacion. ( If you are to be admitted after surgery, leave suitcase in car until your room has been assigned.)  A los pacientes que se les de de alta el mismo dia no se les permitira manejar a casa.  ( Patients discharged on the day of surgery will not be allowed to drive home)  Use ropa suelta y comoda de regreso a Manufacturing engineer. ( wear loose comfortable clothes for ride home)  Firma del paciente (patient signature) ______________________________________   Your procedure is scheduled on:  Tuesday, July 24, 2015  Enter through the [employer] [facility] at:  10:30 AM  Pick up the phone at the desk and  dial [phone].  Call this number if you have problems the morning of surgery: [phone].  Remember: Do NOT eat food:  After midnight Monday   Do NOT drink clear liquids after:  8:00 AM day of surgery  Take these medicines the morning of surgery with a SIP OF WATER: None  Do NOT wear jewelry (body piercing), metal hair clips/bobby pins, make-up, or nail polish. Do NOT wear lotions, powders, or perfumes.  You may wear deodorant. Do NOT shave for 48 hours prior to surgery. Do NOT bring valuables to the hospital. Contacts, dentures, or bridgework may not be worn into surgery.  Leave suitcase in car.  After surgery it may be brought to your room.  For patients admitted to the hospital, checkout time is 11:00 AM the day of discharge.

## 2015-07-24 ENCOUNTER — Observation Stay (HOSPITAL_COMMUNITY)
Admission: RE | Admit: 2015-07-24 | Discharge: 2015-07-25 | Disposition: A | Discharge status: Home / Self Care | Payer: Self-pay | Source: Ambulatory Visit | Attending: Obstetrics & Gynecology | Admitting: Obstetrics & Gynecology

## 2015-07-24 ENCOUNTER — Ambulatory Visit (HOSPITAL_COMMUNITY): Payer: Self-pay | Admitting: Anesthesiology

## 2015-07-24 ENCOUNTER — Encounter (HOSPITAL_COMMUNITY)
Admission: RE | Disposition: A | Discharge status: Home / Self Care | Payer: Self-pay | Source: Ambulatory Visit | Attending: Obstetrics & Gynecology

## 2015-07-24 ENCOUNTER — Encounter (HOSPITAL_COMMUNITY): Payer: Self-pay | Admitting: Obstetrics & Gynecology

## 2015-07-24 DIAGNOSIS — R102 Pelvic and perineal pain: Secondary | ICD-10-CM | POA: Insufficient documentation

## 2015-07-24 DIAGNOSIS — F329 Major depressive disorder, single episode, unspecified: Secondary | ICD-10-CM | POA: Insufficient documentation

## 2015-07-24 DIAGNOSIS — N921 Excessive and frequent menstruation with irregular cycle: Secondary | ICD-10-CM | POA: Diagnosis present

## 2015-07-24 DIAGNOSIS — D251 Intramural leiomyoma of uterus: Secondary | ICD-10-CM | POA: Insufficient documentation

## 2015-07-24 DIAGNOSIS — D649 Anemia, unspecified: Secondary | ICD-10-CM | POA: Insufficient documentation

## 2015-07-24 DIAGNOSIS — E7800 Pure hypercholesterolemia, unspecified: Secondary | ICD-10-CM | POA: Insufficient documentation

## 2015-07-24 DIAGNOSIS — D250 Submucous leiomyoma of uterus: Secondary | ICD-10-CM | POA: Insufficient documentation

## 2015-07-24 DIAGNOSIS — N920 Excessive and frequent menstruation with regular cycle: Principal | ICD-10-CM | POA: Insufficient documentation

## 2015-07-24 DIAGNOSIS — D259 Leiomyoma of uterus, unspecified: Secondary | ICD-10-CM

## 2015-07-24 DIAGNOSIS — K219 Gastro-esophageal reflux disease without esophagitis: Secondary | ICD-10-CM | POA: Insufficient documentation

## 2015-07-24 DIAGNOSIS — N838 Other noninflammatory disorders of ovary, fallopian tube and broad ligament: Secondary | ICD-10-CM | POA: Insufficient documentation

## 2015-07-24 DIAGNOSIS — D219 Benign neoplasm of connective and other soft tissue, unspecified: Secondary | ICD-10-CM | POA: Diagnosis present

## 2015-07-24 DIAGNOSIS — Z9889 Other specified postprocedural states: Secondary | ICD-10-CM

## 2015-07-24 HISTORY — PX: BILATERAL SALPINGECTOMY: SHX5743

## 2015-07-24 HISTORY — PX: VAGINAL HYSTERECTOMY: SHX2639

## 2015-07-24 LAB — PREGNANCY, URINE: Preg Test, Ur: NEGATIVE

## 2015-07-24 SURGERY — HYSTERECTOMY, VAGINAL
Anesthesia: General | Site: Vagina

## 2015-07-24 MED ORDER — VASOPRESSIN 20 UNIT/ML IV SOLN
INTRAVENOUS | Status: DC | PRN
  Administered 2015-07-24: 30 mL

## 2015-07-24 MED ORDER — HYDROMORPHONE HCL 1 MG/ML IJ SOLN
0.5000 mg | INTRAMUSCULAR | Status: DC | PRN
  Administered 2015-07-24 (×2): 0.5 mg via INTRAVENOUS

## 2015-07-24 MED ORDER — HYDROMORPHONE HCL 1 MG/ML IJ SOLN
INTRAMUSCULAR | Status: AC
  Filled 2015-07-24: qty 1

## 2015-07-24 MED ORDER — LIDOCAINE HCL (CARDIAC) 20 MG/ML IV SOLN
INTRAVENOUS | Status: DC | PRN
  Administered 2015-07-24: 100 mg via INTRAVENOUS

## 2015-07-24 MED ORDER — LACTATED RINGERS IV SOLN
INTRAVENOUS | Status: DC
Start: 2015-07-24 — End: 2015-07-24
  Administered 2015-07-24: 1000 mL via INTRAVENOUS

## 2015-07-24 MED ORDER — PANTOPRAZOLE SODIUM 40 MG PO TBEC
40.0000 mg | DELAYED_RELEASE_TABLET | Freq: Every day | ORAL | Status: DC
  Administered 2015-07-24: 40 mg via ORAL
  Filled 2015-07-24: qty 1

## 2015-07-24 MED ORDER — FENTANYL CITRATE (PF) 100 MCG/2ML IJ SOLN
INTRAMUSCULAR | Status: DC | PRN
  Administered 2015-07-24: 50 ug via INTRAVENOUS
  Administered 2015-07-24: 100 ug via INTRAVENOUS
  Administered 2015-07-24 (×2): 50 ug via INTRAVENOUS
  Administered 2015-07-24: 100 ug via INTRAVENOUS

## 2015-07-24 MED ORDER — HYDROMORPHONE HCL 1 MG/ML IJ SOLN
INTRAMUSCULAR | Status: AC
  Administered 2015-07-24: 0.5 mg via INTRAVENOUS
  Filled 2015-07-24: qty 1

## 2015-07-24 MED ORDER — GLYCOPYRROLATE 0.2 MG/ML IJ SOLN
INTRAMUSCULAR | Status: AC
  Filled 2015-07-24: qty 4

## 2015-07-24 MED ORDER — ROCURONIUM BROMIDE 100 MG/10ML IV SOLN
INTRAVENOUS | Status: AC
  Filled 2015-07-24: qty 1

## 2015-07-24 MED ORDER — DEXTROSE-NACL 5-0.45 % IV SOLN
INTRAVENOUS | Status: DC
  Administered 2015-07-25: 01:00:00 via INTRAVENOUS

## 2015-07-24 MED ORDER — DEXAMETHASONE SODIUM PHOSPHATE 4 MG/ML IJ SOLN
INTRAMUSCULAR | Status: AC
  Filled 2015-07-24: qty 1

## 2015-07-24 MED ORDER — FENTANYL CITRATE (PF) 100 MCG/2ML IJ SOLN: 25.0000 ug | INTRAMUSCULAR | Status: DC | PRN

## 2015-07-24 MED ORDER — DEXAMETHASONE SODIUM PHOSPHATE 10 MG/ML IJ SOLN
INTRAMUSCULAR | Status: DC | PRN
  Administered 2015-07-24: 10 mg via INTRAVENOUS

## 2015-07-24 MED ORDER — DEXAMETHASONE SODIUM PHOSPHATE 10 MG/ML IJ SOLN
INTRAMUSCULAR | Status: AC
  Filled 2015-07-24: qty 2

## 2015-07-24 MED ORDER — ONDANSETRON HCL 4 MG PO TABS: 4.0000 mg | ORAL_TABLET | Freq: Four times a day (QID) | ORAL | Status: DC | PRN

## 2015-07-24 MED ORDER — MIDAZOLAM HCL 2 MG/2ML IJ SOLN
INTRAMUSCULAR | Status: AC
  Filled 2015-07-24: qty 2

## 2015-07-24 MED ORDER — ESTRADIOL 0.1 MG/GM VA CREA
TOPICAL_CREAM | VAGINAL | Status: AC
  Filled 2015-07-24: qty 42.5

## 2015-07-24 MED ORDER — FENTANYL CITRATE (PF) 250 MCG/5ML IJ SOLN
INTRAMUSCULAR | Status: AC
  Filled 2015-07-24: qty 5

## 2015-07-24 MED ORDER — PROPOFOL 10 MG/ML IV BOLUS
INTRAVENOUS | Status: AC
  Filled 2015-07-24: qty 20

## 2015-07-24 MED ORDER — PROPOFOL 10 MG/ML IV BOLUS
INTRAVENOUS | Status: DC | PRN
  Administered 2015-07-24: 200 mg via INTRAVENOUS

## 2015-07-24 MED ORDER — HYDROMORPHONE HCL 1 MG/ML IJ SOLN
INTRAMUSCULAR | Status: DC | PRN
  Administered 2015-07-24 (×2): 1 mg via INTRAVENOUS

## 2015-07-24 MED ORDER — SCOPOLAMINE 1 MG/3DAYS TD PT72
MEDICATED_PATCH | TRANSDERMAL | Status: DC
Start: 2015-07-24 — End: 2015-07-25
  Administered 2015-07-24: 1.5 mg via TRANSDERMAL
  Filled 2015-07-24: qty 1

## 2015-07-24 MED ORDER — ROCURONIUM BROMIDE 100 MG/10ML IV SOLN
INTRAVENOUS | Status: DC | PRN
  Administered 2015-07-24: 50 mg via INTRAVENOUS
  Administered 2015-07-24: 20 mg via INTRAVENOUS

## 2015-07-24 MED ORDER — LIDOCAINE HCL (CARDIAC) 20 MG/ML IV SOLN
INTRAVENOUS | Status: AC
  Filled 2015-07-24: qty 5

## 2015-07-24 MED ORDER — HYDROMORPHONE HCL 1 MG/ML IJ SOLN: 0.2000 mg | INTRAMUSCULAR | Status: DC | PRN

## 2015-07-24 MED ORDER — VASOPRESSIN 20 UNIT/ML IV SOLN
INTRAVENOUS | Status: AC
  Filled 2015-07-24: qty 1

## 2015-07-24 MED ORDER — LACTATED RINGERS IV SOLN
INTRAVENOUS | Status: DC
  Administered 2015-07-24 (×3): via INTRAVENOUS

## 2015-07-24 MED ORDER — CEFAZOLIN SODIUM-DEXTROSE 2-3 GM-% IV SOLR
INTRAVENOUS | Status: AC
  Filled 2015-07-24: qty 50

## 2015-07-24 MED ORDER — SCOPOLAMINE 1 MG/3DAYS TD PT72
1.0000 | MEDICATED_PATCH | Freq: Once | TRANSDERMAL | Status: DC
  Administered 2015-07-24: 1.5 mg via TRANSDERMAL

## 2015-07-24 MED ORDER — ONDANSETRON HCL 4 MG/2ML IJ SOLN
INTRAMUSCULAR | Status: AC
  Filled 2015-07-24: qty 2

## 2015-07-24 MED ORDER — BUPIVACAINE HCL (PF) 0.5 % IJ SOLN
INTRAMUSCULAR | Status: AC
  Filled 2015-07-24: qty 30

## 2015-07-24 MED ORDER — ONDANSETRON HCL 4 MG/2ML IJ SOLN
INTRAMUSCULAR | Status: DC | PRN
  Administered 2015-07-24: 4 mg via INTRAVENOUS

## 2015-07-24 MED ORDER — ALUM & MAG HYDROXIDE-SIMETH 200-200-20 MG/5ML PO SUSP: 30.0000 mL | ORAL | Status: DC | PRN

## 2015-07-24 MED ORDER — FENTANYL CITRATE (PF) 100 MCG/2ML IJ SOLN
INTRAMUSCULAR | Status: AC
  Filled 2015-07-24: qty 2

## 2015-07-24 MED ORDER — OXYCODONE-ACETAMINOPHEN 5-325 MG PO TABS
1.0000 | ORAL_TABLET | ORAL | Status: DC | PRN
  Administered 2015-07-24: 1 via ORAL
  Filled 2015-07-24: qty 1

## 2015-07-24 MED ORDER — ONDANSETRON HCL 4 MG/2ML IJ SOLN: 4.0000 mg | Freq: Four times a day (QID) | INTRAMUSCULAR | Status: DC | PRN

## 2015-07-24 MED ORDER — SODIUM CHLORIDE 0.9 % IJ SOLN
INTRAMUSCULAR | Status: AC
  Filled 2015-07-24: qty 50

## 2015-07-24 MED ORDER — SUGAMMADEX SODIUM 500 MG/5ML IV SOLN
INTRAVENOUS | Status: DC | PRN
  Administered 2015-07-24: 125.2 mg via INTRAVENOUS

## 2015-07-24 MED ORDER — IBUPROFEN 600 MG PO TABS
600.0000 mg | ORAL_TABLET | Freq: Four times a day (QID) | ORAL | Status: DC | PRN
  Administered 2015-07-25: 600 mg via ORAL
  Filled 2015-07-24: qty 1

## 2015-07-24 MED ORDER — CEFAZOLIN SODIUM-DEXTROSE 2-4 GM/100ML-% IV SOLN
2.0000 g | INTRAVENOUS | Status: AC
  Administered 2015-07-24: 2 g via INTRAVENOUS
  Filled 2015-07-24: qty 100

## 2015-07-24 MED ORDER — SUGAMMADEX SODIUM 200 MG/2ML IV SOLN
INTRAVENOUS | Status: AC
  Filled 2015-07-24: qty 2

## 2015-07-24 MED ORDER — LACTATED RINGERS IV SOLN
INTRAVENOUS | Status: DC
  Administered 2015-07-24 (×2): via INTRAVENOUS

## 2015-07-24 MED ORDER — MIDAZOLAM HCL 5 MG/5ML IJ SOLN
INTRAMUSCULAR | Status: DC | PRN
  Administered 2015-07-24: 2 mg via INTRAVENOUS

## 2015-07-24 MED ORDER — ESTRADIOL 0.1 MG/GM VA CREA
TOPICAL_CREAM | VAGINAL | Status: DC | PRN
  Administered 2015-07-24: 1 via VAGINAL

## 2015-07-24 MED ORDER — DOCUSATE SODIUM 100 MG PO CAPS
100.0000 mg | ORAL_CAPSULE | Freq: Two times a day (BID) | ORAL | Status: DC
  Administered 2015-07-24: 100 mg via ORAL
  Filled 2015-07-24: qty 1

## 2015-07-24 MED ORDER — NEOSTIGMINE METHYLSULFATE 10 MG/10ML IV SOLN
INTRAVENOUS | Status: AC
  Filled 2015-07-24: qty 1

## 2015-07-24 MED ORDER — ONDANSETRON HCL 4 MG/2ML IJ SOLN
INTRAMUSCULAR | Status: AC
Start: 2015-07-24 — End: 2015-07-24
  Filled 2015-07-24: qty 2

## 2015-07-24 SURGICAL SUPPLY — 31 items
APL SRG 38 LTWT LNG FL B (MISCELLANEOUS) ×2
APPLICATOR ARISTA FLEXITIP XL (MISCELLANEOUS) ×2 IMPLANT
BLADE SURG 10 STRL SS (BLADE) ×6 IMPLANT
CANISTER SUCT 3000ML (MISCELLANEOUS) ×4 IMPLANT
CATH ROBINSON RED A/P 16FR (CATHETERS) ×2 IMPLANT
CLOTH BEACON ORANGE TIMEOUT ST (SAFETY) ×4 IMPLANT
CONT PATH 16OZ SNAP LID 3702 (MISCELLANEOUS) IMPLANT
DECANTER SPIKE VIAL GLASS SM (MISCELLANEOUS) ×2 IMPLANT
GAUZE PACKING 2X5 YD STRL (GAUZE/BANDAGES/DRESSINGS) ×2 IMPLANT
GAUZE SPONGE 4X4 16PLY XRAY LF (GAUZE/BANDAGES/DRESSINGS) ×4 IMPLANT
GLOVE BIO SURGEON STRL SZ7 (GLOVE) ×4 IMPLANT
GLOVE BIOGEL PI IND STRL 6.5 (GLOVE) ×2 IMPLANT
GLOVE BIOGEL PI IND STRL 7.0 (GLOVE) ×4 IMPLANT
GLOVE BIOGEL PI INDICATOR 6.5 (GLOVE) ×2
GLOVE BIOGEL PI INDICATOR 7.0 (GLOVE) ×4
GOWN STRL REUS W/TWL LRG LVL3 (GOWN DISPOSABLE) ×16 IMPLANT
GOWN STRL REUS W/TWL XL LVL3 (GOWN DISPOSABLE) ×4 IMPLANT
HEMOSTAT ARISTA ABSORB 3G PWDR (MISCELLANEOUS) ×2 IMPLANT
NDL SPNL 20GX3.5 QUINCKE YW (NEEDLE) IMPLANT
NEEDLE SPNL 20GX3.5 QUINCKE YW (NEEDLE) ×4 IMPLANT
NS IRRIG 1000ML POUR BTL (IV SOLUTION) ×4 IMPLANT
PACK VAGINAL WOMENS (CUSTOM PROCEDURE TRAY) ×4 IMPLANT
PAD OB MATERNITY 4.3X12.25 (PERSONAL CARE ITEMS) ×4 IMPLANT
SHEARS FOC LG CVD HARMONIC 17C (MISCELLANEOUS) ×2 IMPLANT
SUT VIC AB 0 CT1 18XCR BRD8 (SUTURE) ×6 IMPLANT
SUT VIC AB 0 CT1 36 (SUTURE) ×8 IMPLANT
SUT VIC AB 0 CT1 8-18 (SUTURE) ×12
SUT VICRYL 0 TIES 12 18 (SUTURE) ×4 IMPLANT
TOWEL OR 17X24 6PK STRL BLUE (TOWEL DISPOSABLE) ×8 IMPLANT
TRAY FOLEY CATH SILVER 14FR (SET/KITS/TRAYS/PACK) ×4 IMPLANT
WATER STERILE IRR 1000ML POUR (IV SOLUTION) ×4 IMPLANT

## 2015-07-24 NOTE — Op Note (Signed)
07/24/2015  3:02 PM  PATIENT:  Diana Lewis  50 y.o. female  PRE-OPERATIVE DIAGNOSIS:  Menorrhagia and pelvic pain thought to be due to uterine fibroids  POST-OPERATIVE DIAGNOSIS:  Menorrhagia and pelvic pain thought to be due to uterine   PROCEDURE:  Procedure(s): HYSTERECTOMY VAGINAL (N/A) BILATERAL SALPINGECTOMY (Bilateral)  SURGEON:  Surgeon(s) and Role:    * Lavonia Drafts, MD - Primary    * Emily Filbert, MD - Assisting  ANESTHESIA:   general  EBL:  Total I/O In: 2600 [I.V.:2600] Out: 800 [Urine:200; Blood:600]  BLOOD ADMINISTERED:none  DRAINS: none   LOCAL MEDICATIONS USED:  OTHER Vasopressin solution  SPECIMEN:  Source of Specimen:  cervix, uterus and right fallopian tube   DISPOSITION OF SPECIMEN:  PATHOLOGY  COUNTS:  YES  TOURNIQUET:  * No tourniquets in log *  DICTATION: .Note written in EPIC  PLAN OF CARE: Admit for overnight observation  PATIENT DISPOSITION:  PACU - hemodynamically stable.   Delay start of Pharmacological VTE agent (>24hrs) due to surgical blood loss or risk of bleeding: yes  Complications: none immediate   INDICATIONS: The patient is a 50 y.o. [id number] with history of symptomatic uterine fibroids/menorrhagia. The patient made a decision to undergo definite surgical treatment. On the preoperative visit, the risks, benefits, indications, and alternatives of the procedure were reviewed with the patient.  On the day of surgery, the risks of surgery were again discussed with the patient including but not limited to: bleeding which may require transfusion or reoperation; infection which may require antibiotics; injury to bowel, bladder, ureters or other surrounding organs; need for additional procedures; thromboembolic phenomenon, incisional problems and other postoperative/anesthesia complications. Written informed consent was obtained.    OPERATIVE FINDINGS: A 14 week size uterus with normal right tube; hemorrhagic cyst on right  ovary; left adnexa not visualized.  DESCRIPTION OF PROCEDURE:  The patient received intravenous antibiotics and had sequential compression devices applied to her lower extremities while in the preoperative area.  She was then taken to the operating room where general anesthesia was administered and was found to be adequate.  She was placed in the dorsal lithotomy position, and was prepped and draped in a sterile manner.  The patients bladder was drained with a red rubber catheter. After an adequate timeout was performed, attention was turned to her pelvis.  A weighted speculum was then placed in the vagina, and the anterior and posterior lips of the cervix were grasped bilaterally with tenaculums.  The cervix was then injected circumferentially with a dilute Vasopression solution.  The cervix was then circumferentially incised, and the bladder was dissected off the pubocervical fascia without complication.  Th posterior cul-de-sac was entered sharply without difficulty. A suture was placed on the posterior vagina.  A long weighted speculum was inserted into the posterior cul-de-sac.  The Heaney clamp was then used to clamp the uterosacral ligaments on either side.  They were then cut and sutured ligated with 0 Vicryl, and the ligated uterosacral ligaments were transfixed to the posterior lateral vaginal epithelium to further support the vagina and provide hemostasis. Of note, all sutures used in this case were 0 Vicryl unless otherwise noted.   The cardinal ligaments were then clamped and ligated using the Harmonic Ace. The anterior cul-de-sac was then entered sharpely. The uterine vessels and broad ligaments were then serially clamped with the Harmonic Ace and ligated on both sides.  Excellent hemostasis was noted at this point.  Due to the size of the uterus,  it was morcellated using a coring technique.  The uterus was large so this required extensive morcellation.  The cornua were clamped with the Heaney  clamps, transected, and the uterus was delivered and sent to pathology. These pedicles were then suture ligated to ensure hemostasis.  After completion of the hysterectomy, The fallopian tube on the right side was with a Babcock clamp, transected and suture ligated.  The left fallopian tube could not be easily visualized.  All pedicles from the uterosacral ligament to the cornua were examined hemostasis was confirmed.  The vaginal cuff was closed in a running locked fashion.   All instruments were then removed from the pelvis and a vaginal packing saturated with estrogen cream was placed.  The patient tolerated the procedure well.  All instruments, needles, and sponge counts were correct x 2. The patient was taken to the recovery room in stable condition.    Briant Angelillo L. Harraway-Smith, M.D., Cherlynn June

## 2015-07-24 NOTE — Addendum Note (Signed)
Addendum  created 07/24/15 1928 by Genevie Ann, CRNA   Modules edited: Clinical Notes   Clinical Notes:  File: [id number]

## 2015-07-24 NOTE — Anesthesia Preprocedure Evaluation (Addendum)
Anesthesia Evaluation  Patient identified by MRN, date of birth, ID band Patient awake    Reviewed: Allergy & Precautions, H&P , NPO status , Patient's Chart, lab work & pertinent test results  Airway Mallampati: II  TM Distance: >3 FB Neck ROM: full    Dental no notable dental hx. (+) Dental Advisory Given, Teeth Intact   Pulmonary neg pulmonary ROS,    Pulmonary exam normal breath sounds clear to auscultation       Cardiovascular Exercise Tolerance: Good negative cardio ROS Normal cardiovascular exam Rhythm:regular Rate:Normal     Neuro/Psych negative neurological ROS  negative psych ROS   GI/Hepatic negative GI ROS, Neg liver ROS,   Endo/Other  negative endocrine ROS  Renal/GU negative Renal ROS  negative genitourinary   Musculoskeletal   Abdominal   Peds  Hematology negative hematology ROS (+)   Anesthesia Other Findings   Reproductive/Obstetrics negative OB ROS                             Anesthesia Physical Anesthesia Plan  ASA: II  Anesthesia Plan: General   Post-op Pain Management:    Induction: Intravenous  Airway Management Planned: Oral ETT  Additional Equipment:   Intra-op Plan:   Post-operative Plan: Extubation in OR  Informed Consent: I have reviewed the patients History and Physical, chart, labs and discussed the procedure including the risks, benefits and alternatives for the proposed anesthesia with the patient or authorized representative who has indicated his/her understanding and acceptance.   Dental Advisory Given  Plan Discussed with: CRNA  Anesthesia Plan Comments:        Anesthesia Quick Evaluation

## 2015-07-24 NOTE — Anesthesia Postprocedure Evaluation (Signed)
Anesthesia Post Note  Patient: Tarae Montalvo-Chavez  Procedure(s) Performed: Procedure(s) (LRB): HYSTERECTOMY VAGINAL (N/A) BILATERAL SALPINGECTOMY (Bilateral)  Patient location during evaluation: [facility] Anesthesia Type: General Level of consciousness: patient remains intubated per anesthesia plan and awake and alert Pain management: pain level controlled Vital Signs Assessment: post-procedure vital signs reviewed and stable Respiratory status: spontaneous breathing Cardiovascular status: blood pressure returned to baseline Postop Assessment: adequate PO intake and no signs of nausea or vomiting Anesthetic complications: no    Last Vitals:  Filed Vitals:   07/24/15 1745 07/24/15 1847  BP: 111/73 111/31  Pulse: 97 71  Temp:  36.8 C  Resp:  16    Last Pain:  Filed Vitals:   07/24/15 1925  PainSc: 3                  Mckinley Olheiser, Velvet Bathe

## 2015-07-24 NOTE — Brief Op Note (Signed)
07/24/2015  3:02 PM  PATIENT:  Andres Ege  50 y.o. female  PRE-OPERATIVE DIAGNOSIS:  Menorrhagia and pelvic pain thought to be due to uterine fibroids  POST-OPERATIVE DIAGNOSIS:  Menorrhagia and pelvic pain thought to be due to uterine   PROCEDURE:  Procedure(s): HYSTERECTOMY VAGINAL (N/A) BILATERAL SALPINGECTOMY (Bilateral)  SURGEON:  Surgeon(s) and Role:    * Lavonia Drafts, MD - Primary    * Emily Filbert, MD - Assisting  ANESTHESIA:   general  EBL:  Total I/O In: 2600 [I.V.:2600] Out: 800 [Urine:200; Blood:600]  BLOOD ADMINISTERED:none  DRAINS: none   LOCAL MEDICATIONS USED:  OTHER Vasopressin solution  SPECIMEN:  Source of Specimen:  cervix, uterus and right fallopian tube   DISPOSITION OF SPECIMEN:  PATHOLOGY  COUNTS:  YES  TOURNIQUET:  * No tourniquets in log *  DICTATION: .Note written in EPIC  PLAN OF CARE: Admit for overnight observation  PATIENT DISPOSITION:  PACU - hemodynamically stable.   Delay start of Pharmacological VTE agent (>24hrs) due to surgical blood loss or risk of bleeding: yes  Complications: none immediate  Jaidon Ellery L. Harraway-Smith, M.D., Cherlynn June

## 2015-07-24 NOTE — Transfer of Care (Signed)
Immediate Anesthesia Transfer of Care Note  Patient: [facility]  Procedure(s) Performed: Procedure(s): HYSTERECTOMY VAGINAL (N/A) BILATERAL SALPINGECTOMY (Bilateral)  Patient Location: PACU  Anesthesia Type:General  Level of Consciousness: awake and alert   Airway & Oxygen Therapy: Patient Spontanous Breathing and Patient connected to nasal cannula oxygen  Post-op Assessment: Report given to RN and Post -op Vital signs reviewed and stable  Post vital signs: Reviewed and stable  Last Vitals:  Filed Vitals:   07/24/15 1037  Pulse: 68  Temp: 36.2 C  Resp: 20    Complications: No apparent anesthesia complications

## 2015-07-24 NOTE — Anesthesia Procedure Notes (Signed)
Procedure Name: Intubation Date/Time: 07/24/2015 12:48 PM Performed by: Riki Sheer Pre-anesthesia Checklist: Emergency Drugs available, Patient identified, Suction available, Patient being monitored and Timeout performed Patient Re-evaluated:Patient Re-evaluated prior to inductionOxygen Delivery Method: Circle system utilized Preoxygenation: Pre-oxygenation with 100% oxygen Intubation Type: IV induction Ventilation: Mask ventilation without difficulty Laryngoscope Size: Miller and 2 Grade View: Grade I Tube type: Oral Tube size: 7.0 mm Number of attempts: 1 Airway Equipment and Method: Stylet Placement Confirmation: ETT inserted through vocal cords under direct vision,  positive ETCO2,  CO2 detector and breath sounds checked- equal and bilateral Secured at: 21 cm Tube secured with: Tape Dental Injury: Teeth and Oropharynx as per pre-operative assessment

## 2015-07-24 NOTE — Anesthesia Postprocedure Evaluation (Signed)
Anesthesia Post Note  Patient: Diana Lewis  Procedure(s) Performed: Procedure(s) (LRB): HYSTERECTOMY VAGINAL (N/A) BILATERAL SALPINGECTOMY (Bilateral)  Patient location during evaluation: PACU Anesthesia Type: General Level of consciousness: awake and alert and oriented Pain management: pain level controlled Vital Signs Assessment: post-procedure vital signs reviewed and stable Respiratory status: spontaneous breathing, nonlabored ventilation and respiratory function stable Cardiovascular status: blood pressure returned to baseline and stable Postop Assessment: no signs of nausea or vomiting Anesthetic complications: no    Last Vitals:  Filed Vitals:   07/24/15 1605 07/24/15 1615  BP:  106/61  Pulse: 73 73  Temp:    Resp: 14 12    Last Pain:  Filed Vitals:   07/24/15 1622  PainSc: 5                  Emmitt Matthews A.

## 2015-07-24 NOTE — H&P (Signed)
Preoperative History and Physical  Diana Lewis is a 50 y.o. [id number] here for surgical management of uterine fibroids and menorrhagia.   Proposed surgery: Total vaginal hysterectomy with bilateral salpingectomy  Past Medical History  Diagnosis Date  . Anemia   . Metrorrhagia   . Fibroids   . Hypercholesterolemia     diet controlled cholesterol  . Depression   . GERD (gastroesophageal reflux disease)   . Muscle pain   . Hand pain   . Hepatitis     unknown type 27 years ago   Past Surgical History  Procedure Laterality Date  . Cholecystectomy    . Dilation and curettage of uterus      x 2  . Hyseroscopy    . Hysteroscopy  October 27, 2011   OB History    Gravida Para Term Preterm AB TAB SAB Ectopic Multiple Living   3 1 1  0 2 0 2 0 0 1     Patient denies any cervical dysplasia or STIs. Prescriptions prior to admission  Medication Sig Dispense Refill Last Dose  . ferrous sulfate 325 (65 FE) MG tablet Take 325 mg by mouth 3 (three) times daily.   07/17/2015  . ibuprofen (ADVIL,MOTRIN) 800 MG tablet Take 800 mg by mouth every 8 (eight) hours as needed.   Past Week at Unknown time  . norgestimate-ethinyl estradiol (ORTHO-CYCLEN,SPRINTEC,PREVIFEM) 0.25-35 MG-MCG tablet Take 1 tablet by mouth daily. (Patient not taking: Reported on 07/24/2015) 1 Package 11     No Known Allergies Social History:   reports that she has never smoked. She has never used smokeless tobacco. She reports that she drinks alcohol. She reports that she does not use illicit drugs. Family History  Problem Relation Age of Onset  . Anesthesia problems Neg Hx   . Hyperlipidemia Mother     Review of Systems: Noncontributory  PHYSICAL EXAM: Pulse 68, temperature 97.1 F (36.2 C), temperature source Oral, resp. rate 20, SpO2 100 %. General appearance - alert, well appearing, and in no distress Chest - clear to auscultation, no wheezes, rales or rhonchi, symmetric air entry Heart - normal rate and regular  rhythm Abdomen - soft, nontender, nondistended, no masses or organomegaly Pelvic - examination not indicated Extremities - peripheral pulses normal, no pedal edema, no clubbing or cyanosis  Labs: Results for orders placed or performed during the hospital encounter of 07/17/15 (from the past 336 hour(s))  CBC   Collection Time: 07/17/15  9:20 AM  Result Value Ref Range   WBC 7.5 4.0 - 10.5 K/uL   RBC 4.25 3.87 - 5.11 MIL/uL   Hemoglobin 12.8 12.0 - 15.0 g/dL   HCT 38.1 36.0 - 46.0 %   MCV 89.6 78.0 - 100.0 fL   MCH 30.1 26.0 - 34.0 pg   MCHC 33.6 30.0 - 36.0 g/dL   RDW 12.8 11.5 - 15.5 %   Platelets 312 150 - 400 K/uL  Type and screen [place]   Collection Time: 07/17/15  9:20 AM  Result Value Ref Range   ABO/RH(D) O POS    Antibody Screen NEG    Sample Expiration 07/31/2015    Extend sample reason NO TRANSFUSIONS OR PREGNANCY IN THE PAST 3 MONTHS   ABO/Rh   Collection Time: 07/17/15  9:20 AM  Result Value Ref Range   ABO/RH(D) O POS     Imaging Studies: 08/10/2014 CLINICAL DATA: Patient with menorrhagia and abnormal uterine bleeding. Prior myomectomy 2013.  EXAM: TRANSABDOMINAL AND TRANSVAGINAL ULTRASOUND OF  PELVIS  TECHNIQUE: Both transabdominal and transvaginal ultrasound examinations of the pelvis were performed. Transabdominal technique was performed for global imaging of the pelvis including uterus, ovaries, adnexal regions, and pelvic cul-de-sac. It was necessary to proceed with endovaginal exam following the transabdominal exam to visualize the endometrium.  COMPARISON: Pelvic ultrasound 06/11/2011  FINDINGS: Uterus  Measurements: 11.5 x 6.7 x 8.3 cm. Innumerable fibroids are demonstrated. There is a 3.3 x 3.2 x 2.5 cm submucosal fibroid within the posterior uterine body. There is an additional 2.0 x 2.0 x 2.4 cm submucosal fibroid within the posterior aspect of the uterine fundus. There is a 3.2 x 2.1 x 1.7 cm intramural  fibroid within the left uterine body. Multiple additional smaller fibroids are demonstrated. Fibroids are demonstrated throughout the uterus.  Endometrium  Distorted due to multiple fibroids. Not well measured. Small amount of endometrial fluid.  Right ovary  Measurements: 2.5 x 1.4 x 1.5 cm. Normal appearance/no adnexal mass.  Left ovary  Measurements: 2.1 x 1.5 x 1.8 cm. Normal appearance/no adnexal mass.  Other findings  No free fluid.  IMPRESSION: Multiple uterine fibroids as above. A few of these fibroids have a submucosal component.  The endometrium is not well visualized and is distorted due to the multiple uterine fibroids. Small amount of endometrial fluid. Assessment: Patient Active Problem List   Diagnosis Date Noted  . Menometrorrhagia 07/11/2011  . Fibroids 07/11/2011    Plan: Patient will undergo surgical management with [facility] with bilateral salpingectomy.   The risks of surgery were discussed in detail with the patient including but not limited to: bleeding which may require transfusion or reoperation; infection which may require antibiotics; injury to surrounding organs which may involve bowel, bladder, ureters ; need for additional procedures including laparoscopy or laparotomy; thromboembolic phenomenon, surgical site problems and other postoperative/anesthesia complications. Likelihood of success in alleviating the patient's condition was discussed. Routine postoperative instructions will be reviewed with the patient and her family in detail after surgery.  The patient concurred with the proposed plan, giving informed written consent for the surgery.  Patient has been NPO since last night she will remain NPO for procedure.  Anesthesia and OR aware.  Preoperative prophylactic antibiotics and SCDs ordered on call to the OR.  To OR when ready.  Evanee Lubrano L. Ihor Dow, M.D., [facility] 07/24/2015 11:26 AM

## 2015-07-25 ENCOUNTER — Encounter (HOSPITAL_COMMUNITY): Payer: Self-pay | Admitting: Obstetrics & Gynecology

## 2015-07-25 LAB — CBC
HCT: 27.4 % — ABNORMAL LOW (ref 36.0–46.0)
HEMOGLOBIN: 9.3 g/dL — AB (ref 12.0–15.0)
MCH: 30.1 pg (ref 26.0–34.0)
MCHC: 33.9 g/dL (ref 30.0–36.0)
MCV: 88.7 fL (ref 78.0–100.0)
Platelets: 241 10*3/uL (ref 150–400)
RBC: 3.09 MIL/uL — AB (ref 3.87–5.11)
RDW: 13 % (ref 11.5–15.5)
WBC: 15.4 10*3/uL — AB (ref 4.0–10.5)

## 2015-07-25 MED ORDER — DOCUSATE SODIUM 100 MG PO CAPS: 100.0000 mg | ORAL_CAPSULE | Freq: Two times a day (BID) | ORAL | Status: DC

## 2015-07-25 MED ORDER — FERROUS SULFATE 325 (65 FE) MG PO TABS: 325.0000 mg | ORAL_TABLET | Freq: Three times a day (TID) | ORAL | Status: DC

## 2015-07-25 MED ORDER — OXYCODONE-ACETAMINOPHEN 5-325 MG PO TABS: 1.0000 | ORAL_TABLET | ORAL | Status: DC | PRN

## 2015-07-25 MED ORDER — IBUPROFEN 800 MG PO TABS: 800.0000 mg | ORAL_TABLET | Freq: Three times a day (TID) | ORAL | Status: DC | PRN

## 2015-07-25 NOTE — Discharge Instructions (Signed)
Histerectoma vaginal asistida por laparoscopa, cuidados posteriores (Laparoscopically Assisted Vaginal Hysterectomy, Care After) Siga estas instrucciones durante las prximas semanas. Estas indicaciones le proporcionan informacin acerca de cmo deber cuidarse despus del procedimiento. El mdico tambin podr darle instrucciones ms especficas. El tratamiento se ha planificado de acuerdo a las prcticas mdicas actuales, pero a veces se producen problemas. Comunquese con el mdico si tiene algn problema o tiene dudas despus del procedimiento. QU ESPERAR DESPUS DEL PROCEDIMIENTO Despus del procedimiento, es tpico tener los siguientes sntomas:  Dolor abdominal. Le darn analgsicos para Financial controller.  Podr sentir dolor de garganta por el tubo del respirador que le colocaron durante la [place]. INSTRUCCIONES PARA EL CUIDADO EN EL HOGAR  Utilice los medicamentos de venta libre o recetados para Glass blower/designer, el malestar o la fiebre, segn se lo indique el mdico.  No tome aspirina. Puede ocasionar hemorragias.  No conduzca mientras est tomando analgsicos.  Siga las indicaciones de su mdico con respecto a la dieta, la actividad fsica, levantar objetos, conducir y para las actividades en general.  Reanude su dieta habitual, segn las indicaciones y los permisos.  Descanse y duerma lo suficiente.  Nose haga duchas vaginales, no utilice tampones ni tenga relaciones sexuales durante al menos 6 semanas o hasta que el profesional la autorice.  Cambie los apsitos (vendajes) tal como le indic su mdico.  Controle su temperatura y notifique a su mdico si tiene fiebre.  Tome duchas en lugar de baos durante 2 a 3 semanas.  No beba alcohol hasta que el mdico la autorice.  Si est estreida, tome un laxante suave, si el mdico la [place]. Los alimentos que contienen salvado la ayudarn para el problema de la estreimiento. Debe ingerir la cantidad de lquidos  suficientes para Theatre manager la orina de tono claro o color amarillo plido.  Trate de que alguien la acompae en su casa durante 1 o 2semanas, para ayudarla con los [employer].  Cumpla con todas las visitas de control, segn le indique su mdico. SOLICITE ATENCIN MDICA SI:   Observa enrojecimiento, hinchazn o siente dolor cada vez ms intenso en los sitios de las incisiones.  Tiene pus en el sitio de la incisin.  Advierte un olor feo que proviene de la incisin.  La incisin se abre.  Se siente mareada o sufre un desmayo.  Siente dolor o tiene una hemorragia al [employer].  Tiene diarrea persistente.  Tiene nuseas o vmitos persistentes.  Tiene flujo vaginal anormal.  Tiene una erupcin cutnea.  Tiene alguna reaccin anormal o aparece una alergia por los medicamentos.  El dolor no se alivia con los medicamentos recetados. SOLICITE ATENCIN MDICA DE INMEDIATO SI:   Tiene fiebre.  Siente un dolor abdominal intenso.  Siente dolor en el pecho.  Le falta el aire.  Se desmaya.  Siente dolor, u observa hinchazn o enrojecimiento en la pierna.  Tiene una hemorragia vaginal abundante, con cogulos sanguneos. ASEGRESE DE QUE:  Comprende estas instrucciones.  Controlar su afeccin.  Recibir ayuda de inmediato si no mejora o si empeora.   Esta informacin no tiene Marine scientist el consejo del mdico. Asegrese de hacerle al mdico cualquier pregunta que tenga.   Document Released: 03/27/2011 Document Revised: 04/12/2013 Elsevier Interactive Patient Education [employer].

## 2015-07-25 NOTE — Discharge Summary (Signed)
Physician Discharge Summary  Patient ID: Diana Lewis MRN: [id number] DOB/AGE: 05/18/1965 50 y.o.  Admit date: 07/24/2015 Discharge date: 07/25/2015  Admission Diagnoses: AUB and fibroids  Discharge Diagnoses:  Principal Problem:   Fibroids Active Problems:   Menometrorrhagia   Post-operative state   Discharged Condition: good  Hospital Course: Patient had an uncomplicated surgery; for further details of this surgery, please refer to the operative note. Furthermore, the patient had an uncomplicated postoperative course.  By time of discharge, her pain was controlled on oral pain medications; she was ambulating, voiding without difficulty, tolerating regular diet without N/V and passing flatus.  She was deemed stable for discharge to home.    Consults: None  Significant Diagnostic Studies: labs: CBC  Treatments: surgery: Total vaginal hysterectomy with right salpingectomy  Discharge Exam: Blood pressure 95/46, pulse 81, temperature 98.1 F (36.7 C), temperature source Oral, resp. rate 18, height 4\' 11"  (1.499 m), weight 138 lb (62.596 kg), SpO2 100 %.  I/O last 3 completed shifts: In: 5808 [P.O.:660; I.V.:4581; Other:567] Out: 3775 [Urine:3175; Blood:600] Total I/O In: -  Out: 500 [Urine:500]   General appearance: alert and no distress Resp: clear to auscultation bilaterally Cardio: regular rate and rhythm, S1, S2 normal, no murmur, click, rub or gallop GI: soft, non-tender; bowel sounds normal; no masses,  no organomegaly Extremities: extremities normal, atraumatic, no cyanosis or edema  CBC    Component Value Date/Time   WBC 15.4* 07/25/2015 0520   RBC 3.09* 07/25/2015 0520   HGB 9.3* 07/25/2015 0520   HCT 27.4* 07/25/2015 0520   PLT 241 07/25/2015 0520   MCV 88.7 07/25/2015 0520   MCH 30.1 07/25/2015 0520   MCHC 33.9 07/25/2015 0520   RDW 13.0 07/25/2015 0520     Disposition: 01-Home or Self Care     Medication List    TAKE these medications       docusate sodium 100 MG capsule  Commonly known as:  COLACE  Take 1 capsule (100 mg total) by mouth 2 (two) times daily.     ferrous sulfate 325 (65 FE) MG tablet  Take 1 tablet (325 mg total) by mouth 3 (three) times daily.     ibuprofen 800 MG tablet  Commonly known as:  ADVIL,MOTRIN  Take 1 tablet (800 mg total) by mouth every 8 (eight) hours as needed.     oxyCODONE-acetaminophen 5-325 MG tablet  Commonly known as:  PERCOCET/ROXICET  Take 1-2 tablets by mouth every 4 (four) hours as needed (moderate to severe pain (when tolerating fluids)).           Follow-up Information    Follow up with Lavonia Drafts, MD In 2 weeks.   Specialty:  Obstetrics and Gynecology   Contact information:   [place] [address] [phone]       Signed: Lavonia Drafts 07/25/2015, 8:50 AM

## 2015-07-25 NOTE — Progress Notes (Signed)

## 2015-08-10 ENCOUNTER — Ambulatory Visit: Payer: Self-pay | Admitting: Obstetrics & Gynecology

## 2015-08-10 ENCOUNTER — Telehealth: Payer: Self-pay | Admitting: Obstetrics & Gynecology

## 2015-08-10 NOTE — Telephone Encounter (Signed)
Pt forgot  About her appt this am.  I called her with the Spanish interpreter and she  Reports no problems.  She is voiding and passing stools without difficulty.  She has no pain but, her appetite is decreased a bit.  She denies N/V.  She has no bleeding.  She will retrun to work in 6 weeks and feel comfortable following up at her previously scheduled 4/254 appt.  All questions were answered.  Marne Meline L. Harraway-Smith, M.D., [facility] June

## 2015-09-12 ENCOUNTER — Encounter: Payer: Self-pay | Admitting: Obstetrics & Gynecology

## 2015-09-12 ENCOUNTER — Ambulatory Visit (INDEPENDENT_AMBULATORY_CARE_PROVIDER_SITE_OTHER): Payer: Self-pay | Admitting: Obstetrics & Gynecology

## 2015-09-12 VITALS — BP 134/82 | HR 87 | Wt 143.8 lb

## 2015-09-12 DIAGNOSIS — Z9889 Other specified postprocedural states: Secondary | ICD-10-CM

## 2015-09-12 NOTE — Progress Notes (Signed)
Patient ID: Diana Lewis, female   DOB: 1965/09/28, 50 y.o.   MRN: [id number] [employer] interpreter Naysha [phone] used for encounter.

## 2015-09-12 NOTE — Patient Instructions (Signed)
Histerectoma vaginal asistida por laparoscopa, cuidados posteriores (Laparoscopically Assisted Vaginal Hysterectomy, Care After) Siga estas instrucciones durante las prximas semanas. Estas indicaciones le proporcionan informacin acerca de cmo deber cuidarse despus del procedimiento. El mdico tambin podr darle instrucciones ms especficas. El tratamiento se ha planificado de acuerdo a las prcticas mdicas actuales, pero a veces se producen problemas. Comunquese con el mdico si tiene algn problema o tiene dudas despus del procedimiento. QU ESPERAR DESPUS DEL PROCEDIMIENTO Despus del procedimiento, es tpico tener los siguientes sntomas:  Dolor abdominal. Le darn analgsicos para Financial controller.  Podr sentir dolor de garganta por el tubo del respirador que le colocaron durante la [place]. INSTRUCCIONES PARA EL CUIDADO EN EL HOGAR  Utilice los medicamentos de venta libre o recetados para Glass blower/designer, el malestar o la fiebre, segn se lo indique el mdico.  No tome aspirina. Puede ocasionar hemorragias.  No conduzca mientras est tomando analgsicos.  Siga las indicaciones de su mdico con respecto a la dieta, la actividad fsica, levantar objetos, conducir y para las actividades en general.  Reanude su dieta habitual, segn las indicaciones y los permisos.  Descanse y duerma lo suficiente.  Nose haga duchas vaginales, no utilice tampones ni tenga relaciones sexuales durante al menos 6 semanas o hasta que el profesional la autorice.  Cambie los apsitos (vendajes) tal como le indic su mdico.  Controle su temperatura y notifique a su mdico si tiene fiebre.  Tome duchas en lugar de baos durante 2 a 3 semanas.  No beba alcohol hasta que el mdico la autorice.  Si est estreida, tome un laxante suave, si el mdico la [place]. Los alimentos que contienen salvado la ayudarn para el problema de la estreimiento. Debe ingerir la cantidad de lquidos  suficientes para Theatre manager la orina de tono claro o color amarillo plido.  Trate de que alguien la acompae en su casa durante 1 o 2semanas, para ayudarla con los [employer].  Cumpla con todas las visitas de control, segn le indique su mdico. SOLICITE ATENCIN MDICA SI:   Observa enrojecimiento, hinchazn o siente dolor cada vez ms intenso en los sitios de las incisiones.  Tiene pus en el sitio de la incisin.  Advierte un olor feo que proviene de la incisin.  La incisin se abre.  Se siente mareada o sufre un desmayo.  Siente dolor o tiene una hemorragia al [employer].  Tiene diarrea persistente.  Tiene nuseas o vmitos persistentes.  Tiene flujo vaginal anormal.  Tiene una erupcin cutnea.  Tiene alguna reaccin anormal o aparece una alergia por los medicamentos.  El dolor no se alivia con los medicamentos recetados. SOLICITE ATENCIN MDICA DE INMEDIATO SI:   Tiene fiebre.  Siente un dolor abdominal intenso.  Siente dolor en el pecho.  Le falta el aire.  Se desmaya.  Siente dolor, u observa hinchazn o enrojecimiento en la pierna.  Tiene una hemorragia vaginal abundante, con cogulos sanguneos. ASEGRESE DE QUE:  Comprende estas instrucciones.  Controlar su afeccin.  Recibir ayuda de inmediato si no mejora o si empeora.   Esta informacin no tiene Marine scientist el consejo del mdico. Asegrese de hacerle al mdico cualquier pregunta que tenga.   Document Released: 03/27/2011 Document Revised: 04/12/2013 Elsevier Interactive Patient Education [employer].

## 2015-09-12 NOTE — Progress Notes (Signed)
Patient ID: Diana Lewis, female   DOB: Apr 16, 1966, 50 y.o.   MRN: [id number] History:  50 y.o. [id number] here today for 6-8 week post op check. No complaints. No problems with passage of bowel or bladder.  She has no abd/pelvic pain. She does c/o HA for the past 2 days but, only when she is stressed and this is well controlled with Motrin.  The following portions of the patient's history were reviewed and updated as appropriate: allergies, current medications, past family history, past medical history, past social history, past surgical history and problem list.  Review of Systems:  Pertinent items are noted in HPI.  Objective:  Physical Exam Blood pressure 134/82, pulse 87, weight 143 lb 12.8 oz (65.227 kg). Gen: NAD Abd: Soft, nontender and nondistended Pelvic: Normal appearing external genitalia; normal appearing vaginal mucosa.  Cuff well healed.  Normal discharge.  No palpable masses or adnexal tenderness  Labs and Imaging 07/24/2015 Diagnosis Uterus and cervix, with right fallopian tube - UTERUS -ENDOMETRIUM: PROLIFERATIVE ENDOMETRIUM. NO HYPERPLASIA OR MALIGNANCY. -MYOMETRIUM: LEIOMYOMATA. NO MALIGNANCY. -SEROSA: UNREMARKABLE. - CERVIX: BENIGN SQUAMOUS AND ENDOCERVICAL MUCOSA. NO DYSPLASIA OR MALIGNANCY. - RIGHT FALLOPIAN TUBE: PARATUBAL CYSTS. NO MALIGNANCY.  Assessment & Plan:  Pt presents for 6 week post op check. She is doing well. No problems  surg path reviewed May RTW Return to full activities gradually. F/u in 3 months or sooner prn  Diana Lewis L. Harraway-Smith, M.D., [facility]

## 2015-12-06 ENCOUNTER — Ambulatory Visit (INDEPENDENT_AMBULATORY_CARE_PROVIDER_SITE_OTHER): Payer: Self-pay | Admitting: Obstetrics & Gynecology

## 2015-12-06 ENCOUNTER — Encounter: Payer: Self-pay | Admitting: Obstetrics & Gynecology

## 2015-12-06 VITALS — BP 133/79 | HR 86 | Wt 141.0 lb

## 2015-12-06 DIAGNOSIS — R102 Pelvic and perineal pain: Secondary | ICD-10-CM

## 2015-12-06 DIAGNOSIS — N941 Unspecified dyspareunia: Secondary | ICD-10-CM

## 2015-12-06 NOTE — Progress Notes (Signed)
History:  50 y.o. [id number] here today for 3 month check. She reports some mild pain on her lower right side occ. She has had intercourse since surgery but reports that she thinks its too soon so she stopped.    The following portions of the patient's history were reviewed and updated as appropriate: allergies, current medications, past family history, past medical history, past social history, past surgical history and problem list.  Review of Systems:  Pertinent items are noted in HPI.  Objective:  Physical Exam Blood pressure 133/79, pulse 86, weight 141 lb (64 kg), last menstrual period 07/08/2015. Gen: NAD Abd: Soft, nontender and nondistended Pelvic: deferred   Assessment & Plan:  Very mild pain in lower abd that is not present currently   Vaginal lubricants prn intercourse F/u in 1 year or sooner   Alnisa Hasley L. Harraway-Smith, M.D., [facility]

## 2015-12-06 NOTE — Patient Instructions (Signed)
Name of Product Description Perfume- Free Paraben-Free Glycerin-Free PH-balanced Cost per month  Hyalo-GYN Gel in Tampon applicator containing Hydeal-D, a natural source of moisture. Http://www.hyalogyn.com Yes No Yes No $25  Replens Gel in tampon applicator, Clings to vaginal lining to keep it moist Yes Yes No Yes $15-32  K-Y Liquibeads Suppository that melts in the vagina Yes Yes No No $18-36  Neogyn Cream Cream to soothe vulvar dryness and pain, contains cutaneous lysate, a healing ingredient; not internal moisturizer but may help with irritation on the vulvar  http://www.neogyncream.com/ Yes Yes No No $39  Lubricants  - Water or silicone-based  - No perfumes; avoid glycerin or parabens  - If water based look for information on osmolality  - Lubricate all surfaces as a part of foreplay  - Keep lubricant handy in case more is needed  - Sneaking into bathroom before sex is not a good way to use lubricants   Vaginal Moisturizers  

## 2016-06-04 ENCOUNTER — Other Ambulatory Visit (HOSPITAL_COMMUNITY): Payer: Self-pay | Admitting: *Deleted

## 2016-06-04 DIAGNOSIS — N644 Mastodynia: Secondary | ICD-10-CM

## 2016-06-19 ENCOUNTER — Encounter (HOSPITAL_COMMUNITY): Payer: Self-pay

## 2016-06-19 ENCOUNTER — Ambulatory Visit (HOSPITAL_COMMUNITY)
Admission: RE | Admit: 2016-06-19 | Discharge: 2016-06-19 | Disposition: A | Discharge status: Home / Self Care | Payer: Self-pay | Source: Ambulatory Visit | Attending: Obstetrics and Gynecology | Admitting: Obstetrics and Gynecology

## 2016-06-19 ENCOUNTER — Other Ambulatory Visit: Payer: Self-pay

## 2016-06-19 ENCOUNTER — Ambulatory Visit
Admission: RE | Admit: 2016-06-19 | Discharge: 2016-06-19 | Disposition: A | Discharge status: Home / Self Care | Payer: No Typology Code available for payment source | Source: Ambulatory Visit | Attending: Obstetrics and Gynecology | Admitting: Obstetrics and Gynecology

## 2016-06-19 VITALS — BP 108/60 | Temp 97.6°F | Ht 60.0 in | Wt 140.8 lb

## 2016-06-19 DIAGNOSIS — N644 Mastodynia: Secondary | ICD-10-CM

## 2016-06-19 DIAGNOSIS — Z1239 Encounter for other screening for malignant neoplasm of breast: Secondary | ICD-10-CM

## 2016-06-19 NOTE — Progress Notes (Signed)
Complaints of right nipple pain that comes and goes x 4 months. Patient rates the pain at a 4 out of 10.  Pap Smear: Pap smear not completed today. Last Pap smear was 08/04/14 at [facility] and normal with negative HPV. Per patient has no history of an abnormal Pap smear. Patient has a history of a hysterectomy 07/24/2015 for fibroids. Patient no longer needs Pap smears due to her history of a hysterectomy for benign reasons per BCCCP and ACOG guidelines. Last Pap smear and hysterectomy report is in EPIC.  Physical exam: Breasts Breasts symmetrical. No skin abnormalities bilateral breasts. No nipple retraction bilateral breasts. No nipple discharge bilateral breasts. No lymphadenopathy. No lumps palpated bilateral breasts. No complaints of pain or tenderness on exam. Referred patient to the [facility] for diagnostic mammogram. Appointment scheduled for Thursday, June 19, 2016 at 0850.        Pelvic/Bimanual No Pap smear completed today since last Pap smear and HPV typing was 08/04/2014 and patient has a history of a hysterectomy for benign reasons. Pap smear not indicated per BCCCP guidelines.   Smoking History: Patient has never smoked.  Patient Navigation: Patient education provided. Access to services provided for patient through [facility] program. Spanish interpreter provided.  Used Spanish interpreter [employer] from [facility].

## 2016-06-19 NOTE — Patient Instructions (Signed)
Explained breast self awareness with [facility]. Patient did not need a Pap smear today due to last Pap smear and HPV typing was 08/04/2014 and patient has a history of a hysterectomy for benign reasons. Let patient know that she no longer needs Pap smears due to her history of a hysterectomy for benign reasons. Referred patient to the [facility] for diagnostic mammogram. Appointment scheduled for Thursday, June 19, 2016 at 0850. Diana Lewis verbalized understanding.  Arvin Abello, Arvil Chaco, RN 8:23 AM

## 2016-06-20 ENCOUNTER — Encounter (HOSPITAL_COMMUNITY): Payer: Self-pay | Admitting: *Deleted

## 2016-06-23 ENCOUNTER — Encounter (HOSPITAL_COMMUNITY): Payer: Self-pay | Admitting: *Deleted

## 2016-06-23 ENCOUNTER — Emergency Department (HOSPITAL_COMMUNITY)
Admission: EM | Admit: 2016-06-23 | Discharge: 2016-06-23 | Disposition: A | Discharge status: Home / Self Care | Payer: Self-pay | Attending: Emergency Medicine | Admitting: Emergency Medicine

## 2016-06-23 ENCOUNTER — Emergency Department (HOSPITAL_COMMUNITY): Payer: Self-pay

## 2016-06-23 DIAGNOSIS — M7522 Bicipital tendinitis, left shoulder: Secondary | ICD-10-CM

## 2016-06-23 DIAGNOSIS — M791 Myalgia, unspecified site: Secondary | ICD-10-CM

## 2016-06-23 DIAGNOSIS — R319 Hematuria, unspecified: Secondary | ICD-10-CM | POA: Insufficient documentation

## 2016-06-23 DIAGNOSIS — R911 Solitary pulmonary nodule: Secondary | ICD-10-CM | POA: Insufficient documentation

## 2016-06-23 DIAGNOSIS — M7521 Bicipital tendinitis, right shoulder: Secondary | ICD-10-CM | POA: Insufficient documentation

## 2016-06-23 LAB — URINALYSIS, ROUTINE W REFLEX MICROSCOPIC
Bilirubin Urine: NEGATIVE
Glucose, UA: NEGATIVE mg/dL
KETONES UR: NEGATIVE mg/dL
Leukocytes, UA: NEGATIVE
Nitrite: NEGATIVE
PROTEIN: NEGATIVE mg/dL
RBC / HPF: NONE SEEN RBC/hpf (ref 0–5)
Specific Gravity, Urine: 1.009 (ref 1.005–1.030)
pH: 5 (ref 5.0–8.0)

## 2016-06-23 LAB — COMPREHENSIVE METABOLIC PANEL
ALBUMIN: 4 g/dL (ref 3.5–5.0)
ALT: 22 U/L (ref 14–54)
AST: 35 U/L (ref 15–41)
Alkaline Phosphatase: 67 U/L (ref 38–126)
Anion gap: 10 (ref 5–15)
BUN: 10 mg/dL (ref 6–20)
CHLORIDE: 101 mmol/L (ref 101–111)
CO2: 21 mmol/L — AB (ref 22–32)
Calcium: 9.2 mg/dL (ref 8.9–10.3)
Creatinine, Ser: 0.57 mg/dL (ref 0.44–1.00)
GFR calc Af Amer: >60 mL/min (ref >60)
GFR calc non Af Amer: >60 mL/min (ref >60)
Glucose, Bld: 99 mg/dL (ref 65–99)
Potassium: 4.1 mmol/L (ref 3.5–5.1)
SODIUM: 132 mmol/L — AB (ref 135–145)
Total Bilirubin: 1.1 mg/dL (ref 0.3–1.2)
Total Protein: 6.8 g/dL (ref 6.5–8.1)

## 2016-06-23 LAB — CBC WITH DIFFERENTIAL/PLATELET
BASOS ABS: 0 10*3/uL (ref 0.0–0.1)
Basophils Relative: 0 %
EOS ABS: 0 10*3/uL (ref 0.0–0.7)
EOS PCT: 0 %
HCT: 41 % (ref 36.0–46.0)
Hemoglobin: 14.1 g/dL (ref 12.0–15.0)
LYMPHS PCT: 20 %
Lymphs Abs: 1.7 10*3/uL (ref 0.7–4.0)
MCH: 31.1 pg (ref 26.0–34.0)
MCHC: 34.4 g/dL (ref 30.0–36.0)
MCV: 90.5 fL (ref 78.0–100.0)
Monocytes Absolute: 0.5 10*3/uL (ref 0.1–1.0)
Monocytes Relative: 6 %
Neutro Abs: 6 10*3/uL (ref 1.7–7.7)
Neutrophils Relative %: 74 %
PLATELETS: 253 10*3/uL (ref 150–400)
RBC: 4.53 MIL/uL (ref 3.87–5.11)
RDW: 12.3 % (ref 11.5–15.5)
WBC: 8.2 10*3/uL (ref 4.0–10.5)

## 2016-06-23 LAB — CK: CK TOTAL: 207 U/L (ref 38–234)

## 2016-06-23 MED ORDER — NAPROXEN 250 MG PO TABS
500.0000 mg | ORAL_TABLET | Freq: Once | ORAL | Status: AC
  Administered 2016-06-23: 500 mg via ORAL
  Filled 2016-06-23: qty 2

## 2016-06-23 MED ORDER — NAPROXEN 500 MG PO TABS: 500.0000 mg | ORAL_TABLET | Freq: Two times a day (BID) | ORAL | 0 refills | Status: DC

## 2016-06-23 NOTE — ED Triage Notes (Signed)
Pt reports generalized body aches & R bicep pain worse with lifting, pt reports nausea, denies v/d, denies SOB, A&O x4, pt afebrile

## 2016-06-23 NOTE — ED Notes (Signed)
Called pt for room with no answer 

## 2016-06-23 NOTE — ED Provider Notes (Signed)
[facility] DEPT Provider Note   CSN: [id number] Arrival date & time: 06/23/16  1307   By signing my name below, I, Eunice Blase, attest that this documentation has been prepared under the direction and in the presence of Tanna Furry, MD. Electronically signed, Eunice Blase, ED Scribe. 06/23/16. 3:27 PM.   History   Chief Complaint Chief Complaint  Patient presents with  . Generalized Body Aches   The history is provided by the patient and medical records. A language interpreter was used.    HPI Comments: Diana Lewis is a 51 y.o. female who presents to the Emergency Department complaining of persistent, worsening left arm pain x 1 month. She states her pain is exacerbated with making a fist. She notes generalized body aches began 4 days ago, and she notes associated intermittent epigastric pain, throat discomfort that is not painful, congestion, occasional cough, nausea and chills. She states tylenol improved her symptoms mildly. 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 She is not on any prescription medications at home. Pt denies vomiting, dysuria, hematuria, diarrhea, SOB, sore throat and fever.  Past Medical History:  Diagnosis Date  . Anemia   . Depression   . Fibroids   . GERD (gastroesophageal reflux disease)   . Hand pain   . Hepatitis    unknown type 27 years ago  . Hypercholesterolemia    diet controlled cholesterol  . Metrorrhagia   . Muscle pain     Patient Active Problem List   Diagnosis Date Noted  . Post-operative state 07/24/2015  . Menometrorrhagia 07/11/2011  . Fibroids 07/11/2011    Past Surgical History:  Procedure Laterality Date  . BILATERAL SALPINGECTOMY Bilateral 07/24/2015   Procedure: BILATERAL SALPINGECTOMY;  Surgeon: Lavonia Drafts, MD;  Location: [facility];  Service: Gynecology;  Laterality: Bilateral;  . CHOLECYSTECTOMY    . DILATION AND CURETTAGE OF UTERUS     x 2  . hyseroscopy    . HYSTEROSCOPY  October 27, 2011  . VAGINAL HYSTERECTOMY N/A 07/24/2015   Procedure: HYSTERECTOMY VAGINAL;  Surgeon: Lavonia Drafts, MD;  Location: [facility];  Service: Gynecology;  Laterality: N/A;    OB History    Gravida Para Term Preterm AB Living   3 1 1  0 2 1   SAB TAB Ectopic Multiple Live Births   2 0 0 0         Home Medications    Prior to Admission medications   Medication Sig Start Date End Date Taking? Authorizing Provider  docusate sodium (COLACE) 100 MG capsule Take 1 capsule (100 mg total) by mouth 2 (two) times daily. Patient not taking: Reported on 12/06/2015 07/25/15   Lavonia Drafts, MD  ferrous sulfate 325 (65 FE) MG tablet Take 1 tablet (325 mg total) by mouth 3 (three) times daily. Patient not taking: Reported on 12/06/2015 07/25/15 09/06/19  Lavonia Drafts, MD  ibuprofen (ADVIL,MOTRIN) 800 MG tablet Take 1 tablet (800 mg total) by mouth every 8 (eight) hours as needed. 07/25/15   Lavonia Drafts, MD  naproxen (NAPROSYN) 500 MG tablet Take 1 tablet (500 mg total) by mouth 2 (two) times daily. 06/23/16   Tanna Furry, MD  oxyCODONE-acetaminophen (PERCOCET/ROXICET) 5-325 MG tablet Take 1-2 tablets by mouth every 4 (four) hours as needed (moderate to severe pain (when tolerating fluids)). Patient not taking: Reported on 09/12/2015 07/25/15   Lavonia Drafts, MD    Family History Family History  Problem Relation Age of Onset  . Hyperlipidemia Mother   .  Anesthesia problems Neg Hx     Social History Social History  Substance Use Topics  . Smoking status: Never Smoker  . Smokeless tobacco: Never Used  . Alcohol use Yes     Comment: socially     Allergies   Patient has no known allergies.   Review of Systems Review of Systems  Constitutional: Positive for chills. Negative for appetite change, diaphoresis, fatigue and fever.  HENT: Positive for congestion. Negative for mouth sores, sore throat and trouble swallowing.   Eyes: Negative for visual disturbance.    Respiratory: Positive for cough. Negative for chest tightness, shortness of breath and wheezing.   Cardiovascular: Negative for chest pain.  Gastrointestinal: Positive for abdominal pain and nausea. Negative for abdominal distention, diarrhea and vomiting.  Endocrine: Negative for polydipsia, polyphagia and polyuria.  Genitourinary: Negative for dysuria, frequency and hematuria.  Musculoskeletal: Positive for myalgias. Negative for gait problem.  Skin: Negative for color change, pallor and rash.  Neurological: Negative for dizziness, syncope, light-headedness and headaches.  Hematological: Does not bruise/bleed easily.  Psychiatric/Behavioral: Negative for behavioral problems and confusion.     Physical Exam Updated Vital Signs BP 134/89 (BP Location: Left Arm)   Pulse 104   Temp 99.2 F (37.3 C) (Oral)   Resp 20   LMP 07/08/2015 (Exact Date)   SpO2 100%   Physical Exam  Constitutional: She is oriented to person, place, and time. She appears well-developed and well-nourished. No distress.  Pt appears uncomfortable with movements  HENT:  Head: Normocephalic.  Eyes: Conjunctivae are normal. Pupils are equal, round, and reactive to light. No scleral icterus.  Neck: Normal range of motion. Neck supple. No thyromegaly present.  Cardiovascular: Normal rate, regular rhythm, normal heart sounds and intact distal pulses.  Exam reveals no gallop and no friction rub.   No murmur heard. Pulmonary/Chest: Effort normal and breath sounds normal. No respiratory distress. She has no wheezes. She has no rales.  Abdominal: Soft. Bowel sounds are normal. She exhibits no distension. There is no tenderness. There is no rebound.  Musculoskeletal: Normal range of motion. She exhibits tenderness.  Tenderness to the lateral epicondyle of the L arm; pt complains of diffuse muscular pain  Neurological: She is alert and oriented to person, place, and time.  Skin: Skin is warm and dry. No rash noted. She is  not diaphoretic.  Psychiatric: She has a normal mood and affect. Her behavior is normal.     ED Treatments / Results  DIAGNOSTIC STUDIES: Oxygen Saturation is 100% on RA, normal by my interpretation.    COORDINATION OF CARE: 3:26 PM Discussed treatment plan with pt at bedside and pt agreed to plan. Will order labs and imaging then reassess.   Labs (all labs ordered are listed, but only abnormal results are displayed) Labs Reviewed  COMPREHENSIVE METABOLIC PANEL - Abnormal; Notable for the following:       Result Value   Sodium 132 (*)    CO2 21 (*)    All other components within normal limits  URINALYSIS, ROUTINE W REFLEX MICROSCOPIC - Abnormal; Notable for the following:    APPearance HAZY (*)    Hgb urine dipstick MODERATE (*)    Bacteria, UA RARE (*)    Squamous Epithelial / LPF 6-30 (*)    All other components within normal limits  URINE CULTURE  CBC WITH DIFFERENTIAL/PLATELET  CK    EKG  EKG Interpretation None       Radiology Dg Chest 2 View  Result  Date: 06/23/2016 CLINICAL DATA:  One week history of cough. EXAM: CHEST  2 VIEW COMPARISON:  None. FINDINGS: Lung volumes are low. The lungs are clear wiithout focal pneumonia, edema, pneumothorax or pleural effusion. 7 mm nodule identified right lung overlying the posterior right fifth rib. Probable synostosis between the right fourth and fifth ribs. The cardiopericardial silhouette is within normal limits for size. IMPRESSION: 7 mm right pulmonary nodule. Chest CT without contrast recommended to further evaluate. Electronically Signed   By: Misty Stanley M.D.   On: 06/23/2016 16:26    Procedures Procedures (including critical care time)  Medications Ordered in ED Medications  naproxen (NAPROSYN) tablet 500 mg (500 mg Oral Given 06/23/16 1559)     Initial Impression / Assessment and Plan / ED Course  I have reviewed the triage vital signs and the nursing notes.  Pertinent labs & imaging results that were  available during my care of the patient were reviewed by me and considered in my medical decision making (see chart for details).     I discussed the patient's findings with her at length. She has hematuria. She is status post hysterectomy. Given her urology referral. I also asked her to see coming elsewhere follow-up to check urine culture results. Also to arrange outpatient imaging regarding her pulmonary nodule. This was explained to her at length. She was given a copy of the radiology report of her chest x-ray.  Final Clinical Impressions(s) / ED Diagnoses   Final diagnoses:  Hematuria, unspecified type  Myalgia  Pulmonary nodule  Biceps tendinitis of left upper extremity    New Prescriptions New Prescriptions   NAPROXEN (NAPROSYN) 500 MG TABLET    Take 1 tablet (500 mg total) by mouth 2 (two) times daily.     Tanna Furry, MD 06/23/16 [phone]

## 2016-06-23 NOTE — ED Notes (Signed)
Called pt for room x 2 with no answer.

## 2016-06-23 NOTE — Discharge Instructions (Signed)
You have blood in your urine. An additional test called a culture is being performed to look for infection. If this shows infection we will call you with another medication prescription.   You are being referred to a specialist called a Urologist regarding the blood in yoururine.  Your chest x-ray shows a small area called a pulmonary nodule. This will require follow-up testing through your primary care physician, or the clinic you have been referred to. Take this information with you to your appointment.

## 2016-06-25 LAB — URINE CULTURE

## 2017-03-09 ENCOUNTER — Encounter (HOSPITAL_COMMUNITY): Payer: Self-pay

## 2018-03-06 ENCOUNTER — Other Ambulatory Visit: Payer: Self-pay | Admitting: Pediatrics

## 2018-03-06 MED ORDER — PERMETHRIN 5 % EX CREA: 1.0000 "application " | TOPICAL_CREAM | Freq: Once | CUTANEOUS | 0 refills | Status: AC

## 2018-03-06 NOTE — Progress Notes (Signed)
Treating household contact of scabies

## 2018-04-06 ENCOUNTER — Other Ambulatory Visit (HOSPITAL_COMMUNITY): Payer: Self-pay | Admitting: *Deleted

## 2018-04-06 DIAGNOSIS — Z1231 Encounter for screening mammogram for malignant neoplasm of breast: Secondary | ICD-10-CM

## 2018-04-24 ENCOUNTER — Other Ambulatory Visit: Payer: Self-pay

## 2018-04-24 ENCOUNTER — Encounter (HOSPITAL_COMMUNITY): Payer: Self-pay

## 2018-04-24 ENCOUNTER — Emergency Department (HOSPITAL_COMMUNITY): Payer: No Typology Code available for payment source

## 2018-04-24 ENCOUNTER — Emergency Department (HOSPITAL_COMMUNITY)
Admission: EM | Admit: 2018-04-24 | Discharge: 2018-04-24 | Disposition: A | Discharge status: Home / Self Care | Payer: No Typology Code available for payment source | Attending: Emergency Medicine | Admitting: Emergency Medicine

## 2018-04-24 DIAGNOSIS — J189 Pneumonia, unspecified organism: Secondary | ICD-10-CM | POA: Insufficient documentation

## 2018-04-24 DIAGNOSIS — R509 Fever, unspecified: Secondary | ICD-10-CM | POA: Insufficient documentation

## 2018-04-24 DIAGNOSIS — R69 Illness, unspecified: Secondary | ICD-10-CM

## 2018-04-24 DIAGNOSIS — J111 Influenza due to unidentified influenza virus with other respiratory manifestations: Secondary | ICD-10-CM | POA: Insufficient documentation

## 2018-04-24 DIAGNOSIS — R911 Solitary pulmonary nodule: Secondary | ICD-10-CM | POA: Insufficient documentation

## 2018-04-24 DIAGNOSIS — R319 Hematuria, unspecified: Secondary | ICD-10-CM | POA: Insufficient documentation

## 2018-04-24 LAB — BASIC METABOLIC PANEL
Anion gap: 9 (ref 5–15)
BUN: 11 mg/dL (ref 6–20)
CO2: 21 mmol/L — ABNORMAL LOW (ref 22–32)
Calcium: 9.1 mg/dL (ref 8.9–10.3)
Chloride: 104 mmol/L (ref 98–111)
Creatinine, Ser: 0.66 mg/dL (ref 0.44–1.00)
GFR calc Af Amer: >60 mL/min (ref >60)
GFR calc non Af Amer: >60 mL/min (ref >60)
Glucose, Bld: 123 mg/dL — ABNORMAL HIGH (ref 70–99)
Potassium: 3.6 mmol/L (ref 3.5–5.1)
Sodium: 134 mmol/L — ABNORMAL LOW (ref 135–145)

## 2018-04-24 LAB — URINALYSIS, ROUTINE W REFLEX MICROSCOPIC
Bilirubin Urine: NEGATIVE
Glucose, UA: NEGATIVE mg/dL
KETONES UR: NEGATIVE mg/dL
Leukocytes, UA: NEGATIVE
Nitrite: NEGATIVE
Protein, ur: NEGATIVE mg/dL
Specific Gravity, Urine: 1.028 (ref 1.005–1.030)
pH: 5 (ref 5.0–8.0)

## 2018-04-24 LAB — CBC WITH DIFFERENTIAL/PLATELET
Abs Immature Granulocytes: 0.1 10*3/uL — ABNORMAL HIGH (ref 0.00–0.07)
Basophils Absolute: 0.1 10*3/uL (ref 0.0–0.1)
Basophils Relative: 0 %
Eosinophils Absolute: 0 10*3/uL (ref 0.0–0.5)
Eosinophils Relative: 0 %
HCT: 42.3 % (ref 36.0–46.0)
Hemoglobin: 14.3 g/dL (ref 12.0–15.0)
Immature Granulocytes: 1 %
Lymphocytes Relative: 6 %
Lymphs Abs: 1.1 10*3/uL (ref 0.7–4.0)
MCH: 31.2 pg (ref 26.0–34.0)
MCHC: 33.8 g/dL (ref 30.0–36.0)
MCV: 92.2 fL (ref 80.0–100.0)
Monocytes Absolute: 0.8 10*3/uL (ref 0.1–1.0)
Monocytes Relative: 4 %
Neutro Abs: 16.3 10*3/uL — ABNORMAL HIGH (ref 1.7–7.7)
Neutrophils Relative %: 89 %
Platelets: 249 10*3/uL (ref 150–400)
RBC: 4.59 MIL/uL (ref 3.87–5.11)
RDW: 12.1 % (ref 11.5–15.5)
WBC: 18.4 10*3/uL — AB (ref 4.0–10.5)
nRBC: 0.1 % (ref 0.0–0.2)

## 2018-04-24 MED ORDER — SODIUM CHLORIDE 0.9 % IV BOLUS
1000.0000 mL | Freq: Once | INTRAVENOUS | Status: AC
  Administered 2018-04-24: 1000 mL via INTRAVENOUS

## 2018-04-24 MED ORDER — DOXYCYCLINE HYCLATE 100 MG PO TABS
100.0000 mg | ORAL_TABLET | Freq: Once | ORAL | Status: AC
  Administered 2018-04-24: 100 mg via ORAL
  Filled 2018-04-24: qty 1

## 2018-04-24 MED ORDER — DOXYCYCLINE HYCLATE 100 MG PO CAPS: 100.0000 mg | ORAL_CAPSULE | Freq: Two times a day (BID) | ORAL | 0 refills | Status: AC

## 2018-04-24 NOTE — Discharge Instructions (Addendum)
Please see the information and instructions below regarding your visit.  Your diagnoses today include:  1. Community acquired pneumonia, unspecified laterality   2. Influenza-like illness   3. Pulmonary nodule   4. Hematuria, unspecified type     Tests performed today include: See side panel of your discharge paperwork for testing performed today. Vital signs are listed at the bottom of these instructions.   Chest x-ray has a small pulmonary nodule.  This will need follow-up.  Please see your primary care provider to help arrange follow-up in the form of a CT scan.  Medications prescribed:    Take any prescribed medications only as prescribed, and any over the counter medications only as directed on the packaging.  Doxycycline is an antibiotic that fights infection in the lungs. This medication can make your skin sensitive to the sun, so please ensure that you wear sunscreen, hats, or other coverage over your skin while taking this. This medicine CANNOT be taken by women while pregnant, breastfeeding, or trying to become pregnant.  Please speak with a healthcare provider if any of these situations apply to you.   Home care instructions:  Please follow any educational materials contained in this packet.   Follow-up instructions: Please follow-up with your primary care provider in 5-7 days for further evaluation of your symptoms if they are not completely improved.   Please follow up with Urology regarding the blood in your urine.   Return instructions:  Please return to the Emergency Department if you experience worsening symptoms.  Please return to the emergency department if you develop any worsening body pain, persistent fevers, shortness of breath, chest pain or trouble breathing. Please return for any feeling that you are going to pass out or passing out spells. Please return if you have any other emergent concerns.  Additional Information:   Your vital signs today were: BP  102/75 (BP Location: Right Arm)    Pulse 92    Temp 99.2 F (37.3 C) (Oral)    Resp 16    LMP 07/08/2015 (Exact Date)    SpO2 97%  If your blood pressure (BP) was elevated on multiple readings during this visit above 130 for the top number or above 80 for the bottom number, please have this repeated by your primary care provider within one month. --------------  Thank you for allowing [place] to participate in your care today.   Consulte la informacin e instrucciones a continuacin con respecto a su visita.  Sus diagnsticos de hoy incluyen:  1. Neumona adquirida en la comunidad, lateralidad no especificada  2. Enfermedad similar a la gripe  3. Ndulo pulmonar  4. Hematuria, tipo no especificado   Las pruebas realizadas hoy incluyen: Consulte el panel lateral de su documentacin de descarga para las pruebas realizadas hoy. Los signos vitales se enumeran en la parte inferior de estas instrucciones.   La radiografa de trax tiene un pequeo ndulo pulmonar.  Esto necesitar seguimiento.  Consulte a su proveedor de atencin primaria para ayudar a Training and development officer en forma de una tomografa computarizada.  Medicamentos prescritos:    Programme researcher, broadcasting/film/video prescrito slo segn lo prescrito, y Environmental manager de venta libre slo como se indica en el [place].  La doxiciclina es un antibitico que combate la infeccin en los pulmones. Este medicamento puede hacer que tu piel sea sensible al sol, as que asegrate de Futures trader, sombreros u otra cobertura sobre la piel mientras tomas esto. [place]  por mujeres durante el embarazo, la lactancia o tratando de [place].  Por favor, hable con un proveedor de atencin mdica si alguna de estas situaciones se aplica a usted.  Instrucciones para el cuidado del hogar:  Por favor, siga Consulting civil engineer contenido en este paquete.   Instrucciones de seguimiento: Por favor, haga un  seguimiento con su proveedor de atencin primaria en 5-7 das para una evaluacin adicional de sus sntomas si no se han mejorado por completo.   Por favor, siga con Urologa con respecto a la [employer].   Instrucciones de devolucin:  ? Por favor, regrese al Departamento de Emergencias si experimenta sntomas de empeoramiento.  ? Por favor, regrese al departamento de emergencias si presenta algn empeoramiento del dolor corporal, fiebres persistentes, dificultad para respirar, dolor en el pecho o dificultad para respirar. ? Por favor, regrese si tiene alguna otra inquietud emergente.  Informacin adicional:  Sus signos vitales hoy fueron: BP 102/75 (Ubicacin bp: [place] derecho) Pulso 92 Temp 99,2 oF (37,3 oC) (oral) Resp 16 ? LMP 07/08/2015 (Fecha exacta) SpO2 97%  Si su presin arterial (BP) fue elevada en mltiples lecturas durante esta visita por encima de 130 para el nmero superior o por encima de 80 para el nmero inferior, por favor haga que su proveedor de atencin primaria lo repita dentro de un mes. --------------  Clayburn Pert por permitirnos participar en su atencin hoy.

## 2018-04-24 NOTE — ED Provider Notes (Addendum)
[facility] EMERGENCY DEPARTMENT Provider Note   CSN: [id number] Arrival date & time: 04/24/18  1009     History   Chief Complaint Chief Complaint  Patient presents with  . URI    HPI Diana Lewis is a 53 y.o. female.  HPI  Patient is a 53 year old female with a history of menometrorrhagia status post hysterectomy, presenting for myalgias, chills, nonproductive cough, congestion and rhinorrhea.  She reports that 3 days ago she began to develop a pelvic a cold with congestion, rhinorrhea and sore throat, however she felt much worse today.  She reports she felt febrile did not take her temperature.  She reports she was feeling "chilly" at 3 AM, and felt "dizzy".  Patient denies syncope.  Patient took 2 Tylenol for her symptoms.  She reports that her cough is not productive.  No hemoptysis.  She denies sore throat.  Patient denies dysuria, flank pain or pelvic pain, but does report urinary urgency.  No history of immunocompromise status or primary lung disease.  Patient will be assistance of patient's daughter with intervertebral patient's request.  Past Medical History:  Diagnosis Date  . Anemia   . Depression   . Fibroids   . GERD (gastroesophageal reflux disease)   . Hand pain   . Hepatitis    unknown type 27 years ago  . Hypercholesterolemia    diet controlled cholesterol  . Metrorrhagia   . Muscle pain     Patient Active Problem List   Diagnosis Date Noted  . Post-operative state 07/24/2015  . Menometrorrhagia 07/11/2011  . Fibroids 07/11/2011    Past Surgical History:  Procedure Laterality Date  . BILATERAL SALPINGECTOMY Bilateral 07/24/2015   Procedure: BILATERAL SALPINGECTOMY;  Surgeon: Lavonia Drafts, MD;  Location: [facility];  Service: Gynecology;  Laterality: Bilateral;  . CHOLECYSTECTOMY    . DILATION AND CURETTAGE OF UTERUS     x 2  . hyseroscopy    . HYSTEROSCOPY  October 27, 2011  . VAGINAL HYSTERECTOMY N/A 07/24/2015   Procedure: HYSTERECTOMY VAGINAL;  Surgeon: Lavonia Drafts, MD;  Location: [facility];  Service: Gynecology;  Laterality: N/A;     OB History    Gravida  3   Para  1   Term  1   Preterm  0   AB  2   Living  1     SAB  2   TAB  0   Ectopic  0   Multiple  0   Live Births               Home Medications    Prior to Admission medications   Medication Sig Start Date End Date Taking? Authorizing Provider  docusate sodium (COLACE) 100 MG capsule Take 1 capsule (100 mg total) by mouth 2 (two) times daily. Patient not taking: Reported on 12/06/2015 07/25/15   Lavonia Drafts, MD  ferrous sulfate 325 (65 FE) MG tablet Take 1 tablet (325 mg total) by mouth 3 (three) times daily. Patient not taking: Reported on 12/06/2015 07/25/15 09/06/19  Lavonia Drafts, MD  ibuprofen (ADVIL,MOTRIN) 800 MG tablet Take 1 tablet (800 mg total) by mouth every 8 (eight) hours as needed. 07/25/15   Lavonia Drafts, MD  naproxen (NAPROSYN) 500 MG tablet Take 1 tablet (500 mg total) by mouth 2 (two) times daily. 06/23/16   Tanna Furry, MD  oxyCODONE-acetaminophen (PERCOCET/ROXICET) 5-325 MG tablet Take 1-2 tablets by mouth every 4 (four) hours as needed (moderate to severe pain (when tolerating fluids)).  Patient not taking: Reported on 09/12/2015 07/25/15   Lavonia Drafts, MD    Family History Family History  Problem Relation Age of Onset  . Hyperlipidemia Mother   . Anesthesia problems Neg Hx     Social History Social History   Tobacco Use  . Smoking status: Never Smoker  . Smokeless tobacco: Never Used  Substance Use Topics  . Alcohol use: Yes    Comment: socially  . Drug use: No     Allergies   Patient has no known allergies.   Review of Systems Review of Systems  Constitutional: Positive for chills and fever.  HENT: Positive for congestion and rhinorrhea. Negative for ear discharge, ear pain and trouble swallowing.   Respiratory: Positive for cough.  Negative for chest tightness and shortness of breath.   Cardiovascular: Negative for leg swelling.  Gastrointestinal: Negative for abdominal pain, nausea and vomiting.  Genitourinary: Positive for urgency. Negative for dysuria, flank pain and frequency.  Musculoskeletal: Positive for myalgias.  All other systems reviewed and are negative.    Physical Exam Updated Vital Signs BP 110/82 (BP Location: Right Arm)   Pulse (!) 113   Temp 100.1 F (37.8 C) (Oral)   Resp 18   LMP 07/08/2015 (Exact Date)   SpO2 97%   Physical Exam Vitals signs and nursing note reviewed.  Constitutional:      General: She is not in acute distress.    Appearance: She is well-developed.  HENT:     Head: Normocephalic and atraumatic.     Right Ear: Tympanic membrane normal.     Left Ear: Tympanic membrane normal.  Eyes:     Conjunctiva/sclera: Conjunctivae normal.     Pupils: Pupils are equal, round, and reactive to light.  Neck:     Musculoskeletal: Normal range of motion and neck supple.  Cardiovascular:     Rate and Rhythm: Normal rate and regular rhythm.     Heart sounds: S1 normal and S2 normal. No murmur.  Pulmonary:     Effort: Pulmonary effort is normal.     Breath sounds: No wheezing or rhonchi.     Comments: Slight diminished lung sounds in right lung base.  Abdominal:     General: There is no distension.     Palpations: Abdomen is soft.     Tenderness: There is no abdominal tenderness. There is no guarding.  Musculoskeletal: Normal range of motion.        General: No deformity.  Lymphadenopathy:     Cervical: No cervical adenopathy.  Skin:    General: Skin is warm and dry.     Findings: No erythema or rash.  Neurological:     Mental Status: She is alert.     Comments: Cranial nerves grossly intact. Patient moves extremities symmetrically and with good coordination.  Psychiatric:        Behavior: Behavior normal.        Thought Content: Thought content normal.        Judgment:  Judgment normal.      ED Treatments / Results  Labs (all labs ordered are listed, but only abnormal results are displayed) Labs Reviewed  URINALYSIS, ROUTINE W REFLEX MICROSCOPIC - Abnormal; Notable for the following components:      Result Value   Hgb urine dipstick MODERATE (*)    Bacteria, UA RARE (*)    All other components within normal limits  CBC WITH DIFFERENTIAL/PLATELET - Abnormal; Notable for the following components:   WBC 18.4 (*)  Neutro Abs 16.3 (*)    Abs Immature Granulocytes 0.10 (*)    All other components within normal limits  BASIC METABOLIC PANEL - Abnormal; Notable for the following components:   Sodium 134 (*)    CO2 21 (*)    Glucose, Bld 123 (*)    All other components within normal limits    EKG None  ED ECG REPORT   Date: 04/24/2018  Rate: 91  Rhythm: normal sinus rhythm  QRS Axis: indeterminate  Intervals: normal  ST/T Wave abnormalities: nonspecific T wave changes  Old EKG Reviewed: none available  Confirmed by attending Dr. Quintella Reichert in [place].   Radiology Dg Chest 2 View  Result Date: 04/24/2018 CLINICAL DATA:  Cough and fever for 3 days EXAM: CHEST - 2 VIEW COMPARISON:  06/23/2016 FINDINGS: Stable 7 mm right upper lobe nodule. Low lung volumes. Otherwise clear lungs. Normal heart size. No pneumothorax. IMPRESSION: Stable 7 mm right upper lobe nodule. CT chest was recommended on the prior study. Electronically Signed   By: Marybelle Killings M.D.   On: 04/24/2018 11:52    Procedures Procedures (including critical care time)  Medications Ordered in ED Medications - No data to display   Initial Impression / Assessment and Plan / ED Course  I have reviewed the triage vital signs and the nursing notes.  Pertinent labs & imaging results that were available during my care of the patient were reviewed by me and considered in my medical decision making (see chart for details).  Clinical Course as of Apr 24 1237  Sat Apr 24, 2018  1145  No evidence of UTI. Do not suspect pyelo.   Urinalysis, Routine w reflex microscopic(!) [AM]    Clinical Course User Index [AM] Albesa Seen, PA-C    Patient is nontoxic-appearing, afebrile, however she had antipyretics prior to arrival, and in no acute distress.  Patient with prodromal signs of viral syndrome, suddenly worsening with night sweats and myalgias.  Patient denies any recent long travel, pneumonia, other viral syndrome, pulmonary embolism. Do not suspect PE given viral prodrome and predominance of myalgias and cough without chest pain or shortness of breath.   Patient does have a cough, nonproductive,, chest x-ray appears to have possible early infiltrate.  She does have a stable nodule that she will need follow-up for.  Patient went home and this resolved and given her x-ray report.  She does have a primary care provider to follow-up with.  Patient has leukocytosis of 15.9.  Likely secondary to acute respiratory illness.  Patient has hematuria, which she has had in the past, and was instructed to follow-up with urology for.  Tachycardia resolved after fluids.  Will treat for clinical pneumonia not yet evident on chest x-ray.  Patient well-appearing and stable for discharge.  Patient given return precautions for any persistent fevers, chest pain, shortness of breath, or new or worsening symptoms.  Patient is in understanding and agrees with the plan of care.   Final Clinical Impressions(s) / ED Diagnoses   Final diagnoses:  Influenza-like illness  Pulmonary nodule  Hematuria, unspecified type  Community acquired pneumonia, unspecified laterality    ED Discharge Orders         Ordered    doxycycline (VIBRAMYCIN) 100 MG capsule  2 times daily     04/24/18 1356             Albesa Seen, [place] 04/24/18 1459    Quintella Reichert, MD 04/26/18 [phone]

## 2018-04-24 NOTE — ED Triage Notes (Signed)
Pt presents for evaluation of URI symptoms x 3 days that worsened this morning. Pt reports cold chills and body aches.

## 2018-06-24 ENCOUNTER — Encounter (HOSPITAL_COMMUNITY): Payer: Self-pay

## 2018-06-24 ENCOUNTER — Ambulatory Visit
Admission: RE | Admit: 2018-06-24 | Discharge: 2018-06-24 | Disposition: A | Discharge status: Home / Self Care | Payer: No Typology Code available for payment source | Source: Ambulatory Visit | Attending: Obstetrics and Gynecology | Admitting: Obstetrics and Gynecology

## 2018-06-24 ENCOUNTER — Ambulatory Visit (HOSPITAL_COMMUNITY)
Admission: RE | Admit: 2018-06-24 | Discharge: 2018-06-24 | Disposition: A | Discharge status: Home / Self Care | Payer: Self-pay | Source: Ambulatory Visit | Attending: Obstetrics and Gynecology | Admitting: Obstetrics and Gynecology

## 2018-06-24 VITALS — BP 120/82 | Wt 146.0 lb

## 2018-06-24 DIAGNOSIS — Z1239 Encounter for other screening for malignant neoplasm of breast: Secondary | ICD-10-CM

## 2018-06-24 DIAGNOSIS — Z1231 Encounter for screening mammogram for malignant neoplasm of breast: Secondary | ICD-10-CM

## 2018-06-24 NOTE — Progress Notes (Signed)
Complaints of right breast tenderness x 3 years when it is cold. Patient had a diagnostic mammogram completed 06/19/2016 due to her complaint and was negative.  Pap Smear: Pap smear not completed today. Last Pap smear was 08/04/14 at the [facility] [place] [facility] and normal with negative HPV. Per patient has no history of an abnormal Pap smear. Patient has a history of a hysterectomy 07/24/2015 for fibroids. Patient no longer needs Pap smears due to her history of a hysterectomy for benign reasons per BCCCP and ACOG guidelines. Last Pap smear and hysterectomy report is in EPIC.  Physical exam: Breasts Breasts symmetrical. No skin abnormalities bilateral breasts. No nipple retraction bilateral breasts. No nipple discharge bilateral breasts. No lymphadenopathy. No lumps palpated bilateral breasts. No complaints of pain or tenderness on exam. Referred patient to the [facility] for diagnostic mammogram. Appointment scheduled for Thursday, June 24, 2018 at 1630.         Pelvic/Bimanual No Pap smear completed today since last Pap smear and HPV typing was 08/04/2014 and patient has a history of a hysterectomy for benign reasons. Pap smear not indicated per BCCCP guidelines.   Smoking History: Patient has never smoked.  Patient Navigation: Patient education provided. Access to services provided for patient through [facility] program. Spanish interpreter provided.   Colorectal Cancer Screening: Per patient has never had a colonoscopy completed. No complaints today. FIT Test given to patient to complete and return to BCCCP.  Breast and Cervical Cancer Risk Assessment: Patient has no family history of breast cancer, known genetic mutations, or radiation treatment to the chest before age 57. Patient has no history of cervical dysplasia, immunocompromised, or DES exposure in-utero.  Risk Assessment    Risk Scores      06/24/2018   Last edited by: Armond Hang, LPN   5-year risk: 0.5 %   Lifetime risk: 4.6 %         Used Spanish interpreter Rudene Anda from [place].

## 2018-06-24 NOTE — Patient Instructions (Addendum)
Explained breast self awareness with [facility]. Patient did not need a Pap smear today due to last Pap smear and HPV typing was 08/04/2014 and patient has a history of a hysterectomy for benign reasons. Let patient know that she no longer needs Pap smears due to her history of a hysterectomy for benign reasons. Referred patient to the [facility] for diagnostic mammogram. Appointment scheduled for Thursday, June 24, 2018 at 1630. Let patient know that the Breast Center will follow-up with her within the next couple weeks with results of mammogram by letter or phone.  Diana Lewis verbalized understanding.  Ayako Tapanes, Arvil Chaco, RN 3:37 PM

## 2018-07-07 ENCOUNTER — Other Ambulatory Visit: Payer: Self-pay

## 2018-07-14 LAB — FECAL OCCULT BLOOD, IMMUNOCHEMICAL: FECAL OCCULT BLD: NEGATIVE

## 2018-07-27 ENCOUNTER — Encounter (HOSPITAL_COMMUNITY): Payer: Self-pay | Admitting: *Deleted

## 2018-07-29 ENCOUNTER — Encounter (HOSPITAL_COMMUNITY): Payer: Self-pay

## 2018-07-29 NOTE — Progress Notes (Signed)
Mailed patient FIT Test result letter on 07/29/2018. Negative FIT test result.

## 2019-02-15 ENCOUNTER — Other Ambulatory Visit: Payer: Self-pay

## 2019-02-15 ENCOUNTER — Ambulatory Visit: Payer: Self-pay | Attending: Nurse Practitioner | Admitting: Nurse Practitioner

## 2019-02-15 ENCOUNTER — Encounter: Payer: Self-pay | Admitting: Nurse Practitioner

## 2019-02-15 DIAGNOSIS — Z1329 Encounter for screening for other suspected endocrine disorder: Secondary | ICD-10-CM

## 2019-02-15 DIAGNOSIS — Z13228 Encounter for screening for other metabolic disorders: Secondary | ICD-10-CM

## 2019-02-15 DIAGNOSIS — Z13220 Encounter for screening for lipoid disorders: Secondary | ICD-10-CM

## 2019-02-15 DIAGNOSIS — Z131 Encounter for screening for diabetes mellitus: Secondary | ICD-10-CM

## 2019-02-15 DIAGNOSIS — Z7689 Persons encountering health services in other specified circumstances: Secondary | ICD-10-CM

## 2019-02-15 DIAGNOSIS — Z1321 Encounter for screening for nutritional disorder: Secondary | ICD-10-CM

## 2019-02-15 DIAGNOSIS — Z862 Personal history of diseases of the blood and blood-forming organs and certain disorders involving the immune mechanism: Secondary | ICD-10-CM

## 2019-02-15 NOTE — Progress Notes (Signed)
Virtual Visit via Telephone Note Due to national recommendations of social distancing due to [place] 19, telehealth visit is felt to be most appropriate for this patient at this time.  I discussed the limitations, risks, security and privacy concerns of performing an evaluation and management service by telephone and the availability of in person appointments. I also discussed with the patient that there may be a patient responsible charge related to this service. The patient expressed understanding and agreed to proceed.    I connected with Diana Lewis on 02/15/19  at   8:50 AM EDT  EDT by telephone and verified that I am speaking with the correct person using two identifiers.   Consent I discussed the limitations, risks, security and privacy concerns of performing an evaluation and management service by telephone and the availability of in person appointments. I also discussed with the patient that there may be a patient responsible charge related to this service. The patient expressed understanding and agreed to proceed.   Location of Patient: Private Residence   Location of Provider: [place] and [employer] Office    Persons participating in Telemedicine visit: Diana Rankins FNP-BC [place] Interpreter ID# [id number]   History of Present Illness: Telemedicine visit for: Establish Care  has a past medical history of Anemia, Depression, Fibroids, GERD (gastroesophageal reflux disease), Hand pain, Hepatitis, Hypercholesterolemia, Metrorrhagia, and Muscle pain.   Depression She is not taking an SSRI. Denies any mood lability.   GERD Controlled with Diet.  Hysterectomy 2017  2/2 Fibroids  HEALTH MAINTENANCE She is UTD with Mammogram 06-25-2018. FIT Test negative 07-07-2018.    Past Medical History:  Diagnosis Date  . Anemia   . Depression   . Fibroids   . GERD (gastroesophageal reflux disease)   . Hand pain   .  Hepatitis    unknown type 27 years ago  . Hypercholesterolemia    diet controlled cholesterol  . Metrorrhagia   . Muscle pain     Past Surgical History:  Procedure Laterality Date  . ABDOMINAL HYSTERECTOMY    . BILATERAL SALPINGECTOMY Bilateral 07/24/2015   Procedure: BILATERAL SALPINGECTOMY;  Surgeon: Lavonia Drafts, MD;  Location: [facility];  Service: Gynecology;  Laterality: Bilateral;  . CHOLECYSTECTOMY    . DILATION AND CURETTAGE OF UTERUS     x 2  . hyseroscopy    . HYSTEROSCOPY  October 27, 2011  . VAGINAL HYSTERECTOMY N/A 07/24/2015   Procedure: HYSTERECTOMY VAGINAL;  Surgeon: Lavonia Drafts, MD;  Location: [facility];  Service: Gynecology;  Laterality: N/A;    Family History  Problem Relation Age of Onset  . Hyperlipidemia Mother   . Anesthesia problems Neg Hx     Social History   Socioeconomic History  . Marital status: Single    Spouse name: Not on file  . Number of children: 1  . Years of education: Not on file  . Highest education level: 8th grade  Occupational History  . Not on file  Social Needs  . Financial resource strain: Not on file  . Food insecurity    Worry: Not on file    Inability: Not on file  . Transportation needs    Medical: No    Non-medical: No  Tobacco Use  . Smoking status: Never Smoker  . Smokeless tobacco: Never Used  Substance and Sexual Activity  . Alcohol use: Yes    Comment: socially  . Drug use: No  . Sexual activity: Yes  Birth control/protection: Surgical  Lifestyle  . Physical activity    Days per week: Not on file    Minutes per session: Not on file  . Stress: Not on file  Relationships  . Social Herbalist on phone: Not on file    Gets together: Not on file    Attends religious service: Not on file    Active member of club or organization: Not on file    Attends meetings of clubs or organizations: Not on file    Relationship status: Not on file  Other Topics Concern  . Not on file  Social  History Narrative  . Not on file     Observations/Objective: Awake, alert and oriented x 3   Review of Systems  Constitutional: Negative for fever, malaise/fatigue and weight loss.  HENT: Negative.  Negative for nosebleeds.   Eyes: Negative.  Negative for blurred vision, double vision and photophobia.  Respiratory: Negative.  Negative for cough and shortness of breath.   Cardiovascular: Negative.  Negative for chest pain, palpitations and leg swelling.  Gastrointestinal: Negative.  Negative for heartburn, nausea and vomiting.  Musculoskeletal: Negative.  Negative for myalgias.  Neurological: Negative.  Negative for dizziness, focal weakness, seizures and headaches.  Psychiatric/Behavioral: Negative.  Negative for suicidal ideas.    Assessment and Plan: [name] was seen today for new patient (initial visit).  Diagnoses and all orders for this visit:  Encounter to establish care  Thyroid disorder screening -     TSH; Future  History of anemia -     CBC; Future  Screening for metabolic disorder -     NXG33+[id number]; Future  Lipid screening -     Lipid panel; Future  Diabetes mellitus screening -     Hemoglobin A1c; Future  Encounter for vitamin deficiency screening -     VITAMIN D 25 Hydroxy (Vit-D Deficiency, Fractures); Future     Follow Up Instructions Return for OFFICE VISIT.     I discussed the assessment and treatment plan with the patient. The patient was provided an opportunity to ask questions and all were answered. The patient agreed with the plan and demonstrated an understanding of the instructions.   The patient was advised to call back or seek an in-person evaluation if the symptoms worsen or if the condition fails to improve as anticipated.  I provided 17 minutes of non-face-to-face time during this encounter including median intraservice time, reviewing previous notes, labs, imaging, medications and explaining diagnosis and management.  Gildardo Pounds,  FNP-BC

## 2019-02-22 ENCOUNTER — Ambulatory Visit: Payer: Self-pay | Attending: Family Medicine

## 2019-02-22 ENCOUNTER — Other Ambulatory Visit: Payer: Self-pay

## 2019-02-22 DIAGNOSIS — Z13220 Encounter for screening for lipoid disorders: Secondary | ICD-10-CM

## 2019-02-22 DIAGNOSIS — Z1321 Encounter for screening for nutritional disorder: Secondary | ICD-10-CM

## 2019-02-22 DIAGNOSIS — Z131 Encounter for screening for diabetes mellitus: Secondary | ICD-10-CM

## 2019-02-22 DIAGNOSIS — Z1329 Encounter for screening for other suspected endocrine disorder: Secondary | ICD-10-CM

## 2019-02-22 DIAGNOSIS — Z13228 Encounter for screening for other metabolic disorders: Secondary | ICD-10-CM

## 2019-02-22 DIAGNOSIS — Z862 Personal history of diseases of the blood and blood-forming organs and certain disorders involving the immune mechanism: Secondary | ICD-10-CM

## 2019-02-23 LAB — CMP14+EGFR
ALT: 66 IU/L — ABNORMAL HIGH (ref 0–32)
AST: 36 IU/L (ref 0–40)
Albumin/Globulin Ratio: 1.8 (ref 1.2–2.2)
Albumin: 4.7 g/dL (ref 3.8–4.8)
Alkaline Phosphatase: 86 IU/L (ref 39–117)
BUN/Creatinine Ratio: 23 (ref 9–23)
BUN: 16 mg/dL (ref 6–24)
Bilirubin Total: 0.3 mg/dL (ref 0.0–1.2)
CO2: 22 mmol/L (ref 20–29)
Calcium: 9.7 mg/dL (ref 8.7–10.2)
Chloride: 100 mmol/L (ref 96–106)
Creatinine, Ser: 0.71 mg/dL (ref 0.57–1.00)
GFR calc Af Amer: 115 mL/min/{1.73_m2} (ref >59)
GFR calc non Af Amer: 100 mL/min/{1.73_m2} (ref >59)
Globulin, Total: 2.6 g/dL (ref 1.5–4.5)
Glucose: 103 mg/dL — ABNORMAL HIGH (ref 65–99)
Potassium: 4.2 mmol/L (ref 3.5–5.2)
Sodium: 140 mmol/L (ref 134–144)
Total Protein: 7.3 g/dL (ref 6.0–8.5)

## 2019-02-23 LAB — CBC
Hematocrit: 39.2 % (ref 34.0–46.6)
Hemoglobin: 13.6 g/dL (ref 11.1–15.9)
MCH: 31.1 pg (ref 26.6–33.0)
MCHC: 34.7 g/dL (ref 31.5–35.7)
MCV: 90 fL (ref 79–97)
Platelets: 252 10*3/uL (ref 150–450)
RBC: 4.38 x10E6/uL (ref 3.77–5.28)
RDW: 12.5 % (ref 11.7–15.4)
WBC: 8.1 10*3/uL (ref 3.4–10.8)

## 2019-02-23 LAB — HEMOGLOBIN A1C
Est. average glucose Bld gHb Est-mCnc: 111 mg/dL
Hgb A1c MFr Bld: 5.5 % (ref 4.8–5.6)

## 2019-02-23 LAB — LIPID PANEL
Chol/HDL Ratio: 8.1 ratio — ABNORMAL HIGH (ref 0.0–4.4)
Cholesterol, Total: 242 mg/dL — ABNORMAL HIGH (ref 100–199)
HDL: 30 mg/dL — ABNORMAL LOW (ref >39)
Triglycerides: 1048 mg/dL (ref 0–149)

## 2019-02-23 LAB — VITAMIN D 25 HYDROXY (VIT D DEFICIENCY, FRACTURES): Vit D, 25-Hydroxy: 13.6 ng/mL — ABNORMAL LOW (ref 30.0–100.0)

## 2019-02-23 LAB — TSH: TSH: 2.55 u[IU]/mL (ref 0.450–4.500)

## 2019-02-26 ENCOUNTER — Other Ambulatory Visit: Payer: Self-pay | Admitting: Nurse Practitioner

## 2019-02-26 DIAGNOSIS — D259 Leiomyoma of uterus, unspecified: Secondary | ICD-10-CM

## 2019-02-26 MED ORDER — VITAMIN D (ERGOCALCIFEROL) 1.25 MG (50000 UNIT) PO CAPS: 50000.0000 [IU] | ORAL_CAPSULE | ORAL | 1 refills | Status: DC

## 2019-02-26 MED ORDER — ROSUVASTATIN CALCIUM 10 MG PO TABS: 10.0000 mg | ORAL_TABLET | Freq: Every day | ORAL | 3 refills | Status: DC

## 2019-02-26 MED ORDER — IBUPROFEN 800 MG PO TABS: 800.0000 mg | ORAL_TABLET | Freq: Three times a day (TID) | ORAL | 1 refills | Status: AC | PRN

## 2019-02-26 MED ORDER — FENOFIBRATE 145 MG PO TABS: 145.0000 mg | ORAL_TABLET | Freq: Every day | ORAL | 2 refills | Status: DC

## 2019-02-28 MED FILL — ROSUVASTATIN CALCIUM 10 MG: 10 | 30 days supply | Qty: 30 | Fill #0

## 2019-02-28 MED FILL — VIT D2 1.25 MG (50,000 UNIT: 1.25 MG | 84 days supply | Qty: 12 | Fill #0

## 2019-02-28 MED FILL — IBUPROFEN 800 MG TABLET: 800 | 10 days supply | Qty: 30 | Fill #0

## 2019-03-29 ENCOUNTER — Ambulatory Visit: Payer: Self-pay | Attending: Nurse Practitioner

## 2019-03-29 ENCOUNTER — Other Ambulatory Visit: Payer: Self-pay

## 2019-03-29 DIAGNOSIS — Z13220 Encounter for screening for lipoid disorders: Secondary | ICD-10-CM

## 2019-03-30 LAB — LIPID PANEL
Chol/HDL Ratio: 4.2 ratio (ref 0.0–4.4)
Cholesterol, Total: 138 mg/dL (ref 100–199)
HDL: 33 mg/dL — ABNORMAL LOW (ref >39)
LDL Chol Calc (NIH): 16 mg/dL (ref 0–99)
Triglycerides: 700 mg/dL (ref 0–149)
VLDL Cholesterol Cal: 89 mg/dL — ABNORMAL HIGH (ref 5–40)

## 2019-04-02 ENCOUNTER — Other Ambulatory Visit: Payer: Self-pay | Admitting: Nurse Practitioner

## 2019-04-02 MED ORDER — ROSUVASTATIN CALCIUM 20 MG PO TABS: 20.0000 mg | ORAL_TABLET | Freq: Every day | ORAL | 0 refills | Status: DC

## 2019-04-02 MED ORDER — FENOFIBRATE 160 MG PO TABS: 160.0000 mg | ORAL_TABLET | Freq: Every day | ORAL | 2 refills | Status: DC

## 2019-11-28 ENCOUNTER — Other Ambulatory Visit: Payer: Self-pay | Admitting: Obstetrics and Gynecology

## 2019-11-28 DIAGNOSIS — Z1231 Encounter for screening mammogram for malignant neoplasm of breast: Secondary | ICD-10-CM

## 2019-12-29 ENCOUNTER — Other Ambulatory Visit: Payer: Self-pay

## 2019-12-29 ENCOUNTER — Ambulatory Visit
Admission: RE | Admit: 2019-12-29 | Discharge: 2019-12-29 | Disposition: A | Discharge status: Home / Self Care | Payer: No Typology Code available for payment source | Source: Ambulatory Visit | Attending: Obstetrics and Gynecology | Admitting: Obstetrics and Gynecology

## 2019-12-29 ENCOUNTER — Ambulatory Visit: Payer: Self-pay | Admitting: *Deleted

## 2019-12-29 VITALS — BP 142/78 | Temp 97.1°F | Wt 146.5 lb

## 2019-12-29 DIAGNOSIS — Z1239 Encounter for other screening for malignant neoplasm of breast: Secondary | ICD-10-CM

## 2019-12-29 DIAGNOSIS — Z1231 Encounter for screening mammogram for malignant neoplasm of breast: Secondary | ICD-10-CM

## 2019-12-29 NOTE — Patient Instructions (Addendum)
Explained breast self awareness with [facility]. Patient did not need a Pap smear today due to patient has a history of a hysterectomy for benign reasons. Let patient know that she doesn't need any further Pap smears due to her history of a hysterectomy for benign reasons. Referred patient to the [facility] for a screening mammogram on the mobile unit. Appointment scheduled Thursday, December 29, 2019 at 1100. Patient escorted to mobile unit for screening mammogram following BCCCP appointment. Let patient know the Breast Center will follow up with her within the next couple weeks with results of her mammogram by letter or phone. Diana Lewis verbalized understanding.  Amulya Quintin, Arvil Chaco, RN 9:56 AM

## 2019-12-29 NOTE — Progress Notes (Signed)
Diana Lewis is a 54 y.o. female who presents to [facility] clinic today with complaint of right breast pain when cold x 4 years. Patient rates the pain at a 3 out of 10. Patient has had two mammograms since complaint 06/24/2018 screening and 06/19/2016 diagnostic that were both negative.   Pap Smear: Pap smear not completed today. Last Pap smear was 08/04/14 at the [facility] [place] [facility] [employer] [facility] and normal with negative HPV. Per patient has no history of an abnormal Pap smear. Patient has a history of a hysterectomy 07/24/2015 for fibroids. Patient no longer needs Pap smears due to her history of a hysterectomy for benign reasons per BCCCP and ASCCP guidelines. Last Pap smear and hysterectomy report is in EPIC.   Physical exam: Breasts Breasts symmetrical. No skin abnormalities bilateral breasts. No nipple retraction bilateral breasts. No nipple discharge bilateral breasts. No lymphadenopathy. No lumps palpated bilateral breasts. No complaints of pain or tenderness on exam.       Pelvic/Bimanual Pap is not indicated today per BCCCP guidelines.   Smoking History: Patient has never smoked.   Patient Navigation: Patient education provided. Access to services provided for patient through [place] program. Spanish interpreter Rudene Anda from [facility] provided.   Colorectal Cancer Screening: Per patient has never had colonoscopy completed. FIT Test completed 06/27/2018 and negative. No complaints today.    Breast and Cervical Cancer Risk Assessment: Patient does not have family history of breast cancer, known genetic mutations, or radiation treatment to the chest before age 60. Patient does not have history of cervical dysplasia, immunocompromised, or DES exposure in-utero.  Risk Assessment    Risk Scores      12/29/2019 06/24/2018   Last edited by: Demetrius Revel, LPN Rolena Infante H, LPN   5-year risk: 0.5 % 0.5 %   Lifetime risk: 4.5 % 4.6 %          A: BCCCP exam  without pap smear Complaint of right breast pain when cold.  P: Referred patient to the [facility] for a screening mammogram on the mobile unit. Appointment scheduled Thursday, December 29, 2019 at 1100.  Loletta Parish, RN 12/29/2019 9:55 AM

## 2021-02-26 ENCOUNTER — Other Ambulatory Visit: Payer: Self-pay | Admitting: Obstetrics and Gynecology

## 2021-02-26 DIAGNOSIS — Z1231 Encounter for screening mammogram for malignant neoplasm of breast: Secondary | ICD-10-CM

## 2022-06-02 IMAGING — MG DIGITAL SCREENING BILAT W/ TOMO W/ CAD
6 of 10 series · 6 of 30 positions shown · non-contrast
Comparison: Previous exam(s).

CLINICAL DATA: Screening.

EXAM:
DIGITAL SCREENING BILATERAL MAMMOGRAM WITH TOMO AND CAD

[R CC synth-2D]
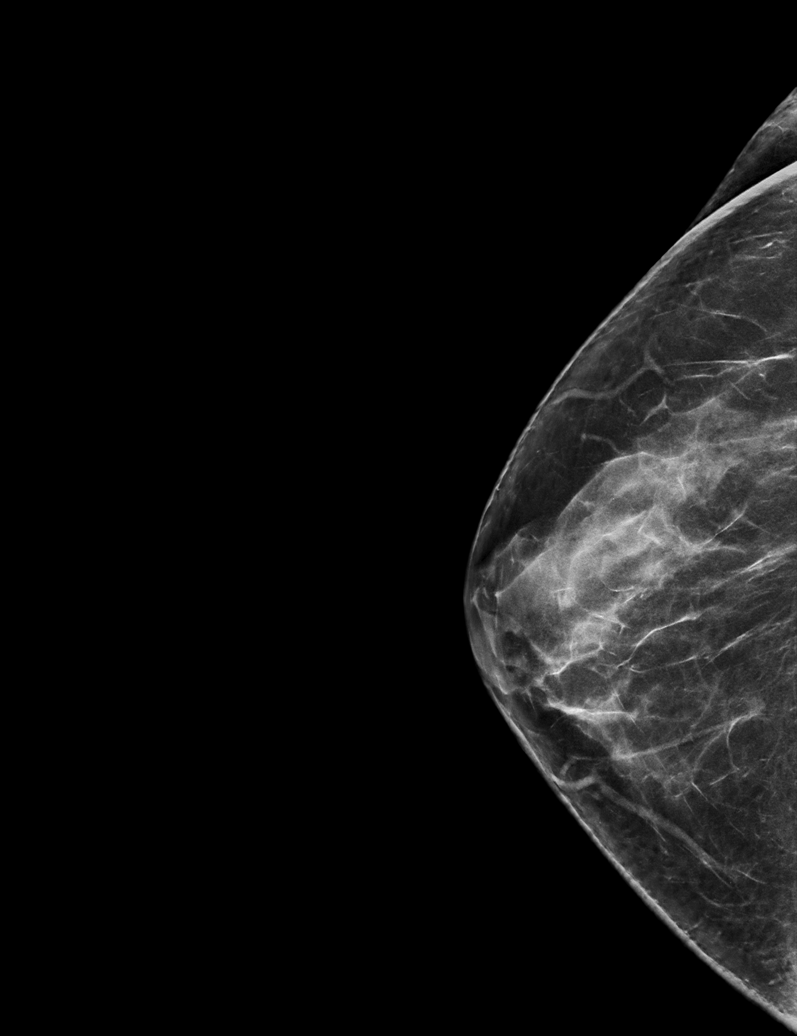

[L MLO synth-2D]
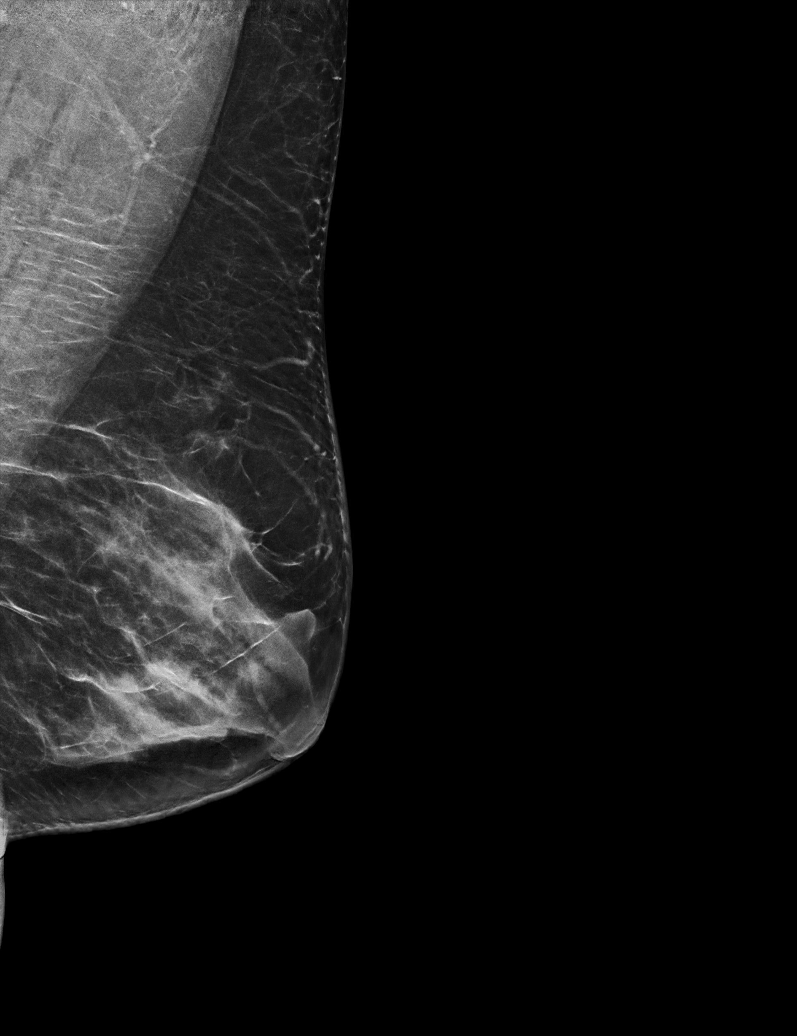

[R MLO synth-2D]
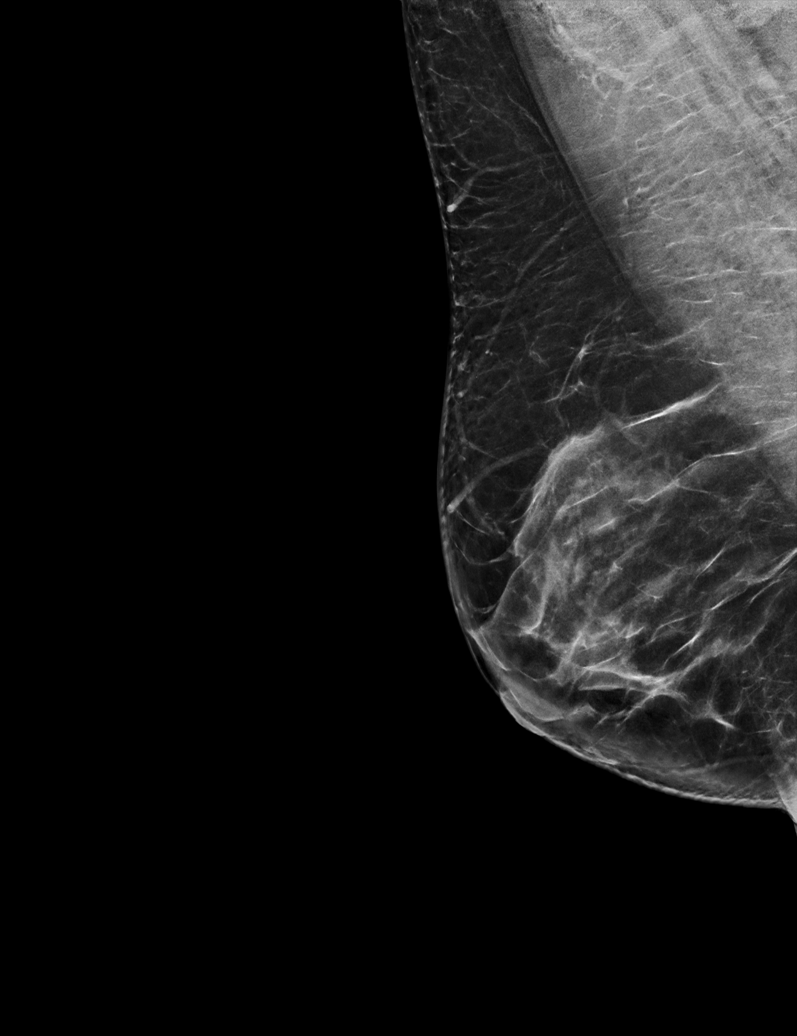

[L CC synth-2D (1 of 2)]
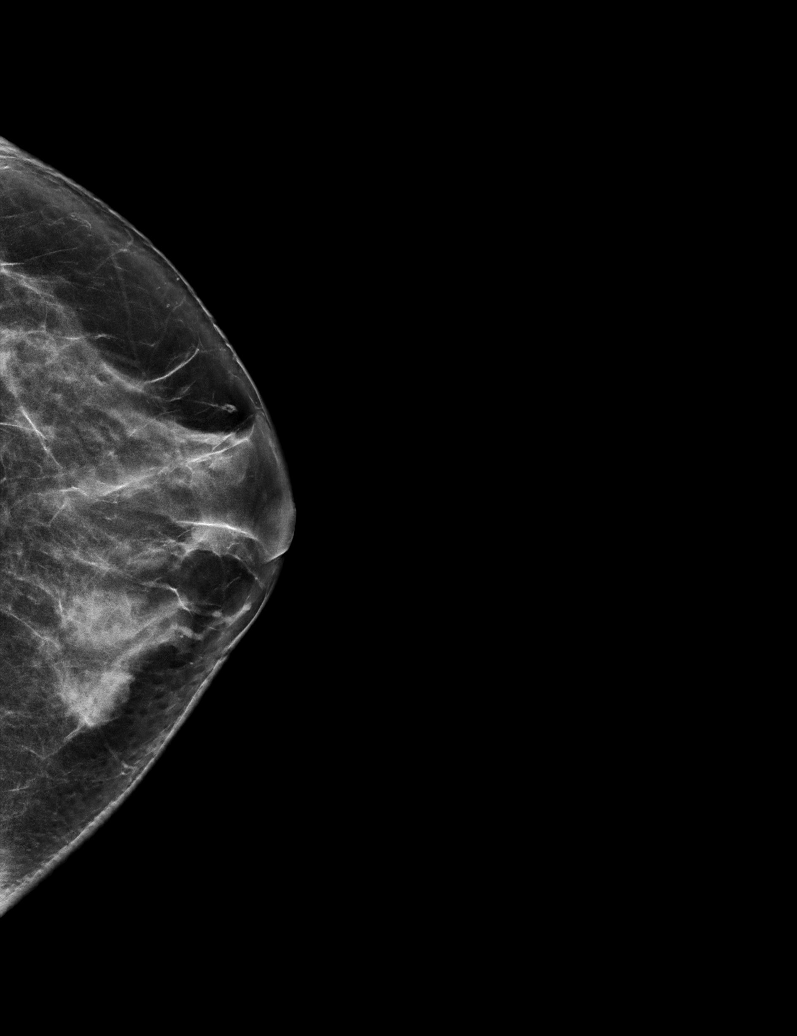

[L CC synth-2D (2 of 2)]
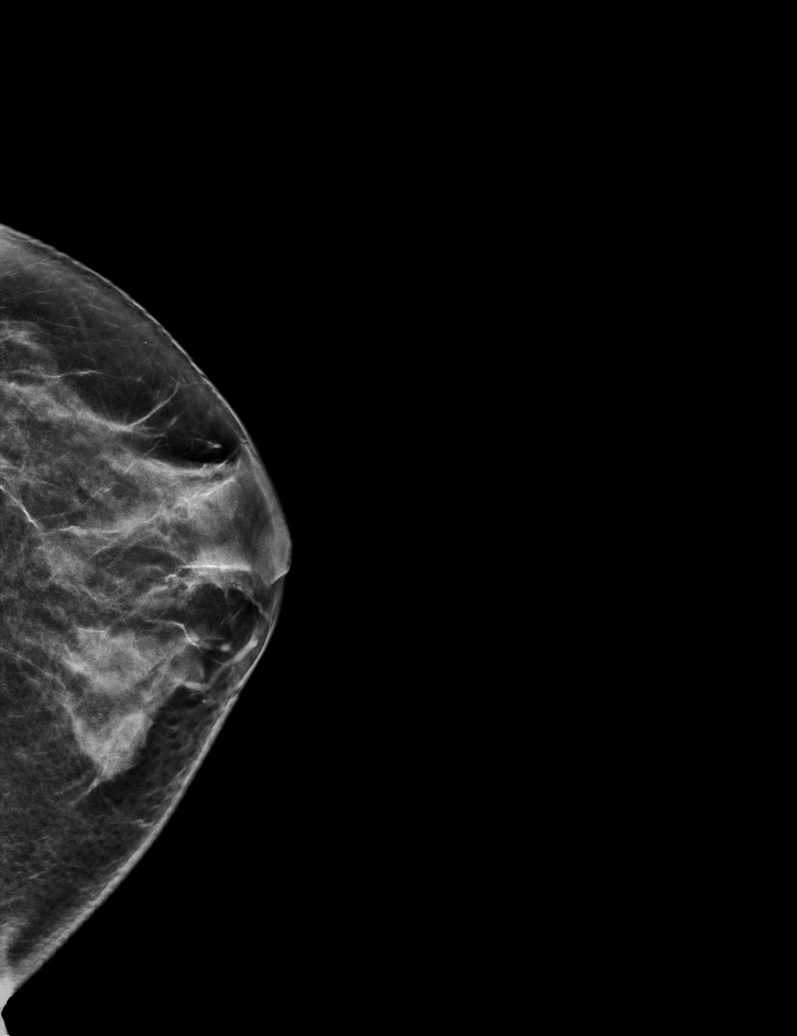

[L MLO tomo · tomo slice 33/65.0]
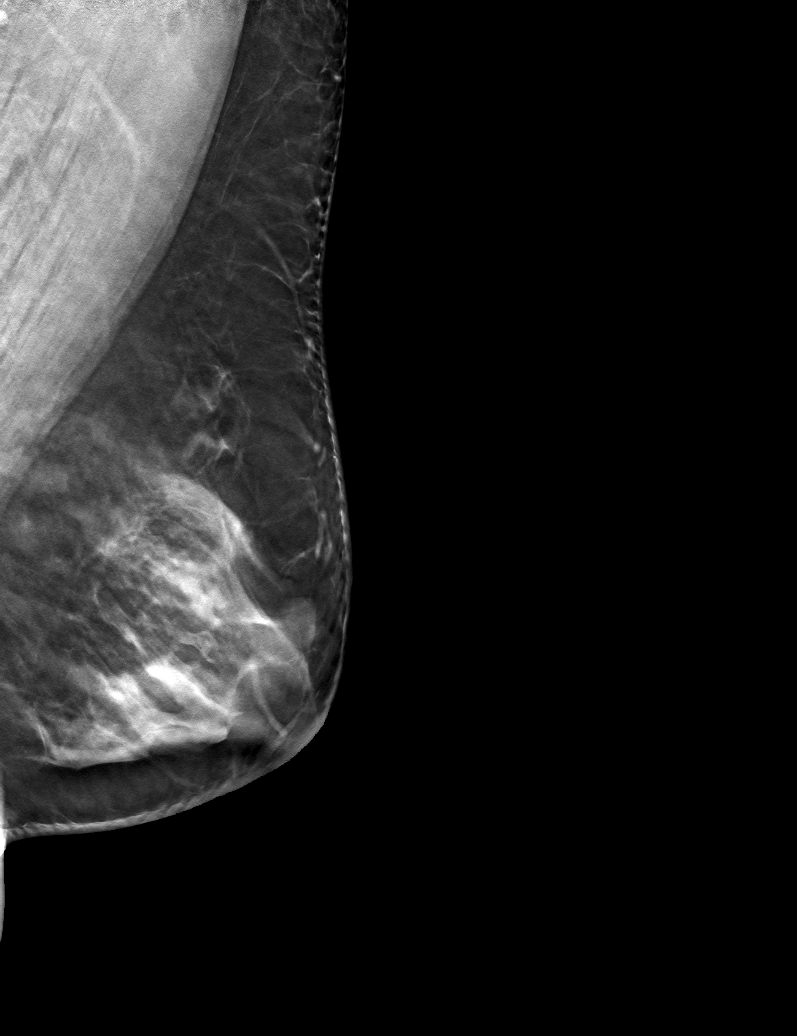

[6 of 30 positions shown; findings below may reference images not displayed]

ACR Breast Density Category c: The breast tissue is heterogeneously
dense, which may obscure small masses.
FINDINGS: There are no findings suspicious for malignancy. Images were
processed with CAD.
IMPRESSION: No mammographic evidence of malignancy. A result letter of this
screening mammogram will be mailed directly to the patient.

RECOMMENDATION:
Screening mammogram in one year. (Code:FT-U-LHB)

BI-RADS CATEGORY  1: Negative.

## 2023-03-26 ENCOUNTER — Ambulatory Visit: Payer: Self-pay | Admitting: Physician Assistant

## 2023-04-23 ENCOUNTER — Ambulatory Visit: Payer: Self-pay | Admitting: Physician Assistant

## 2023-04-23 ENCOUNTER — Encounter: Payer: Self-pay | Admitting: Nurse Practitioner

## 2023-04-24 ENCOUNTER — Encounter: Payer: Self-pay | Admitting: Nurse Practitioner

## 2023-04-24 ENCOUNTER — Other Ambulatory Visit: Payer: Self-pay

## 2023-04-24 ENCOUNTER — Ambulatory Visit: Payer: Self-pay | Attending: Nurse Practitioner | Admitting: Nurse Practitioner

## 2023-04-24 VITALS — BP 128/81 | HR 80 | Ht 60.0 in | Wt 144.4 lb

## 2023-04-24 DIAGNOSIS — R7989 Other specified abnormal findings of blood chemistry: Secondary | ICD-10-CM

## 2023-04-24 DIAGNOSIS — L989 Disorder of the skin and subcutaneous tissue, unspecified: Secondary | ICD-10-CM

## 2023-04-24 DIAGNOSIS — Z1211 Encounter for screening for malignant neoplasm of colon: Secondary | ICD-10-CM

## 2023-04-24 DIAGNOSIS — G5603 Carpal tunnel syndrome, bilateral upper limbs: Secondary | ICD-10-CM

## 2023-04-24 DIAGNOSIS — Z1231 Encounter for screening mammogram for malignant neoplasm of breast: Secondary | ICD-10-CM

## 2023-04-24 DIAGNOSIS — D72829 Elevated white blood cell count, unspecified: Secondary | ICD-10-CM

## 2023-04-24 MED ORDER — GABAPENTIN 300 MG PO CAPS
300.0000 mg | ORAL_CAPSULE | Freq: Three times a day (TID) | ORAL | 3 refills | Status: AC
  Filled 2023-04-24: qty 90, 30d supply, fill #0

## 2023-04-24 NOTE — Progress Notes (Signed)
 Assessment & Plan:  [name] was seen today for skin problem.  Diagnoses and all orders for this visit:  Foot lesion -     Ambulatory referral to Dermatology Patient was urged to apply for the financial assistance program.  They were instructed to inquire at the front desk about the application process for the Jack discount, orange card or other financial assistance.      Bilateral carpal tunnel syndrome -     ANA,IFA RA Diag Pnl w/rflx Tit/Patn -     Ambulatory referral to Hand Surgery -     gabapentin  (NEURONTIN ) 300 MG capsule; Take 1 capsule (300 mg total) by mouth 3 (three) times daily.  Encounter for screening mammogram for malignant neoplasm of breast -     MS 3D SCR MAMMO BILAT BR (aka MM); Future  Colon cancer screening -     Fecal occult blood, imunochemical([facility]/Sun[facility])  Elevated LFTs -     CMP14+EGFR  Leukocytosis, unspecified type -     CBC with Differential    Patient has been counseled on age-appropriate routine health concerns for screening and prevention. These are reviewed and up-to-date. Referrals have been placed accordingly. Immunizations are up-to-date or declined.    Subjective:   Chief Complaint  Patient presents with   Skin Problem    Diana Lewis 58 y.o. female presents to office today to re establish care. I have not seen her in 5 years in this clinic.   VRI was used to communicate directly with patient for the entire encounter including providing detailed patient instructions.    She endorses pain in both hands. Worse at night. Treatments tried at home include ibuprofen , diclofenac gel, ibuprofen  600 mg. All ineffective. Pain radiates from the hands down into the wrists and is constant. She has not tried gabapentin . Aggravating factors: brushing hair, repetitive motion. Associated symptoms: nodules on right pinky and left thumb. Pain has worsened over the past 5 months. Pain is described as numbness and tingling.   She  states she had a wart burned on the side of her right foot but the wart came back. She states the lesion on the side of her right foot has been present for years      Review of Systems  Constitutional:  Negative for fever, malaise/fatigue and weight loss.  HENT: Negative.  Negative for nosebleeds.   Eyes: Negative.  Negative for blurred vision, double vision and photophobia.  Respiratory: Negative.  Negative for cough and shortness of breath.   Cardiovascular: Negative.  Negative for chest pain, palpitations and leg swelling.  Gastrointestinal: Negative.  Negative for heartburn, nausea and vomiting.  Musculoskeletal:  Positive for joint pain. Negative for myalgias.  Skin:        SEE HPI  Neurological: Negative.  Negative for dizziness, focal weakness, seizures and headaches.  Psychiatric/Behavioral: Negative.  Negative for suicidal ideas.     Past Medical History:  Diagnosis Date   Anemia    Depression    Fibroids    GERD (gastroesophageal reflux disease)    Hand pain    Hepatitis    unknown type 27 years ago   Hypercholesterolemia    diet controlled cholesterol   Metrorrhagia    Muscle pain     Past Surgical History:  Procedure Laterality Date   ABDOMINAL HYSTERECTOMY     BILATERAL SALPINGECTOMY Bilateral 07/24/2015   Procedure: BILATERAL SALPINGECTOMY;  Surgeon: Elveria Mungo, MD;  Location: [facility];  Service: Gynecology;  Laterality: Bilateral;  CHOLECYSTECTOMY     DILATION AND CURETTAGE OF UTERUS     x 2   hyseroscopy     HYSTEROSCOPY  October 27, 2011   VAGINAL HYSTERECTOMY N/A 07/24/2015   Procedure: HYSTERECTOMY VAGINAL;  Surgeon: Elveria Mungo, MD;  Location: [facility];  Service: Gynecology;  Laterality: N/A;    Family History  Problem Relation Age of Onset   Hyperlipidemia Mother    Hypertension Father    Healthy Sister    Healthy Brother    Healthy Daughter    Healthy Brother    Anesthesia problems Neg Hx     Social History Reviewed  with no changes to be made today.   Outpatient Medications Prior to Visit  Medication Sig Dispense Refill   diazepam (VALIUM) 2 MG tablet Take 2 mg by mouth daily as needed for anxiety.     ibuprofen  (ADVIL ) 800 MG tablet Take 1 tablet (800 mg total) by mouth every 8 (eight) hours as needed. 30 tablet 1   fenofibrate  160 MG tablet Take 1 tablet (160 mg total) by mouth daily. 90 tablet 2   ferrous sulfate  325 (65 FE) MG tablet Take 1 tablet (325 mg total) by mouth 3 (three) times daily. (Patient not taking: Reported on 12/06/2015) 90 tablet 50   rosuvastatin  (CRESTOR ) 20 MG tablet Take 1 tablet (20 mg total) by mouth at bedtime. 90 tablet 0   Vitamin D , Ergocalciferol , (DRISDOL ) 1.25 MG (50000 UT) CAPS capsule Take 1 capsule (50,000 Units total) by mouth every 7 (seven) days. Please fill as a 90 day supply (Patient not taking: Reported on 04/24/2023) 12 capsule 1   No facility-administered medications prior to visit.    No Known Allergies     Objective:    BP 128/81 (BP Location: Left Arm, Patient Position: Sitting, Cuff Size: Normal)   Pulse 80   Ht 5' (1.524 m)   Wt 144 lb 6.4 oz (65.5 kg)   LMP 07/08/2015 (Exact Date)   SpO2 100%   BMI 28.20 kg/m  Wt Readings from Last 3 Encounters:  04/24/23 144 lb 6.4 oz (65.5 kg)  12/29/19 146 lb 8 oz (66.5 kg)  06/24/18 146 lb (66.2 kg)    Physical Exam Vitals and nursing note reviewed.  Constitutional:      Appearance: She is well-developed.  HENT:     Head: Normocephalic and atraumatic.  Cardiovascular:     Rate and Rhythm: Normal rate and regular rhythm.     Heart sounds: Normal heart sounds. No murmur heard.    No friction rub. No gallop.  Pulmonary:     Effort: Pulmonary effort is normal. No tachypnea or respiratory distress.     Breath sounds: Normal breath sounds. No decreased breath sounds, wheezing, rhonchi or rales.  Chest:     Chest wall: No tenderness.  Abdominal:     General: Bowel sounds are normal.     Palpations:  Abdomen is soft.  Musculoskeletal:        General: Normal range of motion.     Right hand: Deformity and bony tenderness present.     Left hand: Deformity and bony tenderness present.     Cervical back: Normal range of motion.     Comments: Nodules present on right pinky and bottom of left thumb  Skin:    General: Skin is warm and dry.     Comments: SEE PHOTO  Neurological:     Mental Status: She is alert and oriented to person, place, and  time.     Coordination: Coordination normal.  Psychiatric:        Behavior: Behavior normal. Behavior is cooperative.        Thought Content: Thought content normal.        Judgment: Judgment normal.          Patient has been counseled extensively about nutrition and exercise as well as the importance of adherence with medications and regular follow-up. The patient was given clear instructions to go to ER or return to medical center if symptoms don't improve, worsen or new problems develop. The patient verbalized understanding.   Follow-up: Return if symptoms worsen or fail to improve.   Haze LELON Servant, FNP-BC [facility] [place], [place] [phone]   04/24/2023, 2:44 PM

## 2023-04-28 LAB — CMP14+EGFR
ALT: 50 [IU]/L — ABNORMAL HIGH (ref 0–32)
AST: 32 [IU]/L (ref 0–40)
Albumin: 4.7 g/dL (ref 3.8–4.9)
Alkaline Phosphatase: 121 [IU]/L (ref 44–121)
BUN/Creatinine Ratio: 25 — ABNORMAL HIGH (ref 9–23)
BUN: 14 mg/dL (ref 6–24)
Bilirubin Total: <0.2 mg/dL (ref 0.0–1.2)
CO2: 21 mmol/L (ref 20–29)
Calcium: 10 mg/dL (ref 8.7–10.2)
Chloride: 99 mmol/L (ref 96–106)
Creatinine, Ser: 0.57 mg/dL (ref 0.57–1.00)
Globulin, Total: 3 g/dL (ref 1.5–4.5)
Glucose: 94 mg/dL (ref 70–99)
Potassium: 4.5 mmol/L (ref 3.5–5.2)
Sodium: 138 mmol/L (ref 134–144)
Total Protein: 7.7 g/dL (ref 6.0–8.5)
eGFR: 107 mL/min/{1.73_m2} (ref >59)

## 2023-04-28 LAB — CBC WITH DIFFERENTIAL/PLATELET
Basophils Absolute: 0 10*3/uL (ref 0.0–0.2)
Basos: 1 %
EOS (ABSOLUTE): 0.1 10*3/uL (ref 0.0–0.4)
Eos: 1 %
Hematocrit: 43.7 % (ref 34.0–46.6)
Hemoglobin: 14.7 g/dL (ref 11.1–15.9)
Immature Grans (Abs): 0 10*3/uL (ref 0.0–0.1)
Immature Granulocytes: 0 %
Lymphocytes Absolute: 3.2 10*3/uL — ABNORMAL HIGH (ref 0.7–3.1)
Lymphs: 37 %
MCH: 30.3 pg (ref 26.6–33.0)
MCHC: 33.6 g/dL (ref 31.5–35.7)
MCV: 90 fL (ref 79–97)
Monocytes Absolute: 0.6 10*3/uL (ref 0.1–0.9)
Monocytes: 6 %
Neutrophils Absolute: 4.7 10*3/uL (ref 1.4–7.0)
Neutrophils: 55 %
Platelets: 310 10*3/uL (ref 150–450)
RBC: 4.85 x10E6/uL (ref 3.77–5.28)
RDW: 12.5 % (ref 11.7–15.4)
WBC: 8.7 10*3/uL (ref 3.4–10.8)

## 2023-04-28 LAB — ANA,IFA RA DIAG PNL W/RFLX TIT/PATN
Cyclic Citrullin Peptide Ab: 7 U (ref 0–19)
Rheumatoid fact SerPl-aCnc: 11.2 [IU]/mL (ref <14.0)
Rheumatoid fact SerPl-aCnc: 11.2 [IU]/mL (ref <14.0)

## 2023-04-30 ENCOUNTER — Ambulatory Visit: Payer: Self-pay | Admitting: Physician Assistant

## 2023-05-07 ENCOUNTER — Ambulatory Visit: Payer: Self-pay | Admitting: Physician Assistant

## 2023-05-08 ENCOUNTER — Other Ambulatory Visit (INDEPENDENT_AMBULATORY_CARE_PROVIDER_SITE_OTHER): Payer: Self-pay

## 2023-05-08 ENCOUNTER — Ambulatory Visit (INDEPENDENT_AMBULATORY_CARE_PROVIDER_SITE_OTHER): Payer: Self-pay | Admitting: Orthopaedic Surgery

## 2023-05-08 DIAGNOSIS — R202 Paresthesia of skin: Secondary | ICD-10-CM

## 2023-05-08 NOTE — Progress Notes (Signed)
Office Visit Note   Patient: Diana Lewis           Date of Birth: [birth year]           MRN: [id number] Visit Date: 05/08/2023              Requested by: Claiborne Rigg, NP [address] [place],  [place] [address] PCP: Claiborne Rigg, NP   Assessment & Plan: Visit Diagnoses:  1. Paresthesia of hand, bilateral     Plan: Impression is bilateral hand paresthesias likely underlying carpal tunnel syndrome.  We have discussed referral to Dr. Alvester Morin for bilateral upper extremity nerve conduction study/EMG.  We have also discussed night splints.  She will follow-up with [place] after nerve conduction study.  In regards to the nodules these appear to be the result of her job and not rheumatoid nodules.  She had blood work recently that was negative for autoimmune disorders.  Call with concerns or question.  Follow-Up Instructions: Return for f/u after NCS/EMG.   Orders:  Orders Placed This Encounter  Procedures   XR Hand Complete Right   XR Hand Complete Left   Ambulatory referral to Physical Medicine Rehab   No orders of the defined types were placed in this encounter.     Procedures: No procedures performed   Clinical Data: No additional findings.   Subjective: Chief Complaint  Patient presents with   Right Hand - Pain   Left Hand - Pain    HPI patient is a very pleasant 58 year old Spanish-speaking female who is here today with an interpreter.  She is right-handed.  She is here with bilateral hand paresthesias both equally as bad.  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  The symptoms she has are to the hand and all 5 fingers as well as to the volar forearm.  Symptoms are worse at night after she has been working as well as when she is sleeping.  She has tried gabapentin as well as gloves without relief in symptoms.  Review of Systems as detailed in HPI.  All others reviewed and are negative.   Objective: Vital Signs: LMP 07/08/2015 (Exact Date)    Physical Exam well-developed well-nourished female in no acute distress.  Alert and orient x 3.  Ortho Exam bilateral hand exam: Positive Phalen both sides.  Negative Tinel.  She does have decreased sensation to the tips of the thumb, index, long and ring fingers.  Minimal decrease in sensation to the small finger tips.  No thenar atrophy.  Specialty Comments:  No specialty comments available.  Imaging: No new imaging   PMFS History: Patient Active Problem List   Diagnosis Date Noted   Post-operative state 07/24/2015   Menometrorrhagia 07/11/2011   Fibroids 07/11/2011   Past Medical History:  Diagnosis Date   Anemia    Depression    Fibroids    GERD (gastroesophageal reflux disease)    Hand pain    Hepatitis    unknown type 27 years ago   Hypercholesterolemia    diet controlled cholesterol   Metrorrhagia    Muscle pain     Family History  Problem Relation Age of Onset   Hyperlipidemia Mother    Hypertension Father    Healthy Sister    Healthy Brother    Healthy Daughter    Healthy Brother    Anesthesia problems Neg Hx     Past Surgical History:  Procedure Laterality Date   ABDOMINAL HYSTERECTOMY  BILATERAL SALPINGECTOMY Bilateral 07/24/2015   Procedure: BILATERAL SALPINGECTOMY;  Surgeon: Willodean Rosenthal, MD;  Location: [facility];  Service: Gynecology;  Laterality: Bilateral;   CHOLECYSTECTOMY     DILATION AND CURETTAGE OF UTERUS     x 2   hyseroscopy     HYSTEROSCOPY  October 27, 2011   VAGINAL HYSTERECTOMY N/A 07/24/2015   Procedure: HYSTERECTOMY VAGINAL;  Surgeon: Willodean Rosenthal, MD;  Location: [facility];  Service: Gynecology;  Laterality: N/A;   Social History   Occupational History   Not on file  Tobacco Use   Smoking status: Never   Smokeless tobacco: Never  Vaping Use   Vaping status: Never Used  Substance and Sexual Activity   Alcohol use: Yes    Comment: socially   Drug use: No   Sexual activity: Yes    Birth control/protection:  Surgical

## 2023-05-09 LAB — FECAL OCCULT BLOOD, IMMUNOCHEMICAL: Fecal Occult Bld: NEGATIVE

## 2023-05-12 ENCOUNTER — Encounter: Payer: Self-pay | Admitting: Nurse Practitioner

## 2023-05-26 ENCOUNTER — Ambulatory Visit: Payer: Self-pay | Admitting: Physical Medicine and Rehabilitation

## 2023-05-26 DIAGNOSIS — R531 Weakness: Secondary | ICD-10-CM

## 2023-05-26 DIAGNOSIS — M79642 Pain in left hand: Secondary | ICD-10-CM

## 2023-05-26 DIAGNOSIS — R202 Paresthesia of skin: Secondary | ICD-10-CM

## 2023-05-26 DIAGNOSIS — M79641 Pain in right hand: Secondary | ICD-10-CM

## 2023-05-26 NOTE — Progress Notes (Signed)
 Diana Lewis - 58 y.o. female MRN [id number]  Date of birth: Dec 21, 1965  Office Visit Note: Visit Date: 05/26/2023 PCP: Theotis Haze ORN, NP Referred by: Jerri Kay HERO, MD  Subjective: Chief Complaint  Patient presents with   Right Hand - Pain, Numbness, Tingling   Left Hand - Pain, Numbness, Tingling   HPI: Diana Lewis is a 58 y.o. female who comes in today at the request of Dr. Ozell Jerri for evaluation and management of chronic, worsening and severe pain, numbness and tingling in the Bilateral upper extremities.  Patient is Right hand dominant.  There is an interpreter present today.  Patient describes several months of significant hand pain with numbness and tingling globally in both hands with referral up into the forearm to the elbow.  She reports bilateral elbow pain.  No frank radicular pain down the arms.  Does have some history of neck pain off and on but no prior cervical surgery or significant imaging or trauma.  She has no history of diabetes.  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  She reports worsening symptoms at night and it does wake her up at night.  She has used some compression gloves as well as gabapentin  without much relief.  She has no history of thyroid disease.  She does have a lot of difficulty and complains of weakness in grip strength of both hands.   I spent more than 30 minutes speaking face-to-face with the patient with 50% of the time in counseling and discussing coordination of care.       Review of Systems  Musculoskeletal:  Positive for joint pain.  Neurological:  Positive for tingling and focal weakness.  All other systems reviewed and are negative.  Otherwise per HPI.  Assessment & Plan: Visit Diagnoses:    ICD-10-CM   1. Paresthesia of skin  R20.2 NCV with EMG (electromyography)    2. Bilateral hand pain  M79.641    M79.642     3. Weakness  R53.1        Plan: Impression: Clinically symptoms  most consistent with bilateral carpal tunnel syndrome of decent severity.  She likely has some pain from joint arthritis as well and probably musculotendinous overuse.  Electrodiagnostic study performed today.  The above electrodiagnostic study is ABNORMAL and reveals evidence of a severe bilateral R>L median nerve entrapment at the wrist (carpal tunnel syndrome) affecting sensory and motor components.   There is no significant electrodiagnostic evidence of any other focal nerve entrapment, brachial plexopathy or cervical radiculopathy.   Recommendations: 1.  Follow-up with referring physician. 2.  Continue current management of symptoms. 3.  Suggest surgical evaluation.  Meds & Orders: No orders of the defined types were placed in this encounter.   Orders Placed This Encounter  Procedures   NCV with EMG (electromyography)    Follow-up: Return for  Ozell Jerri, MD.   Procedures: No procedures performed  EMG & NCV Findings: Evaluation of the left median motor and the right median motor nerves showed prolonged distal onset latency (L12.5, R13.8 ms), reduced amplitude (L3.1, R0.8 mV), and decreased conduction velocity (Elbow-Wrist, L45, R47 m/s).  The left median (across palm) sensory nerve showed no response (Palm), prolonged distal peak latency (5.1 ms), and reduced amplitude (1.2 V).  The right median (across palm) sensory nerve showed prolonged distal peak latency (Wrist, 8.1 ms), reduced amplitude (1.8 V), and prolonged distal peak latency (Palm, 5.7 ms).  All remaining nerves (  as indicated in the following tables) were within normal limits.  Left vs. Right side comparison data for the median motor nerve indicates abnormal L-R latency difference (1.3 ms) and abnormal L-R amplitude difference (74.2 %).  All remaining left vs. right side differences were within normal limits.    Needle evaluation of the right abductor pollicis brevis muscle showed increased spontaneous activity and diminished  recruitment.  All remaining muscles (as indicated in the following table) showed no evidence of electrical instability.    Impression: The above electrodiagnostic study is ABNORMAL and reveals evidence of a severe bilateral R>L median nerve entrapment at the wrist (carpal tunnel syndrome) affecting sensory and motor components.   There is no significant electrodiagnostic evidence of any other focal nerve entrapment, brachial plexopathy or cervical radiculopathy.   Recommendations: 1.  Follow-up with referring physician. 2.  Continue current management of symptoms. 3.  Suggest surgical evaluation.  ___________________________ Prentice Masters FAAPMR Board Certified, American Board of Physical Medicine and Rehabilitation    Nerve Conduction Studies Anti Sensory Summary Table   Stim Site NR Peak (ms) Norm Peak (ms) P-T Amp (V) Norm P-T Amp Site1 Site2 Delta-P (ms) Dist (cm) Vel (m/s) Norm Vel (m/s)  Left Median Acr Palm Anti Sensory (2nd Digit)  30C  Wrist    *5.1 <3.6 *1.2 >10 Wrist Palm  0.0    Palm *NR  <2.0          Right Median Acr Palm Anti Sensory (2nd Digit)  31.7C  Wrist    *8.1 <3.6 *1.8 >10 Wrist Palm 2.4 0.0    Palm    *5.7 <2.0 0.4         Left Radial Anti Sensory (Base 1st Digit)  30.8C  Wrist    2.0 <3.1 30.5  Wrist Base 1st Digit 2.0 0.0    Right Radial Anti Sensory (Base 1st Digit)  31.7C  Wrist    2.0 <3.1 19.6  Wrist Base 1st Digit 2.0 0.0    Left Ulnar Anti Sensory (5th Digit)  31.1C  Wrist    3.1 <3.7 23.5 >15.0 Wrist 5th Digit 3.1 14.0 45 >38  Right Ulnar Anti Sensory (5th Digit)  32C  Wrist    3.1 <3.7 17.3 >15.0 Wrist 5th Digit 3.1 14.0 45 >38   Motor Summary Table   Stim Site NR Onset (ms) Norm Onset (ms) O-P Amp (mV) Norm O-P Amp Site1 Site2 Delta-0 (ms) Dist (cm) Vel (m/s) Norm Vel (m/s)  Left Median Motor (Abd Poll Brev)  31.2C  Wrist    *12.5 <4.2 *3.1 >5 Elbow Wrist 4.1 18.5 *45 >50  Elbow    16.6  3.0         Right Median Motor (Abd Poll Brev)   31.9C  Wrist    *13.8 <4.2 *0.8 >5 Elbow Wrist 3.7 17.5 *47 >50  Elbow    17.5  0.7         Left Ulnar Motor (Abd Dig Min)  31.4C  Wrist    2.9 <4.2 9.0 >3 B Elbow Wrist 2.6 17.0 65 >53  B Elbow    5.5  8.3  A Elbow B Elbow 1.5 10.0 67 >53  A Elbow    7.0  8.1         Right Ulnar Motor (Abd Dig Min)  32C  Wrist    2.9 <4.2 10.2 >3 B Elbow Wrist 2.9 18.0 62 >53  B Elbow    5.8  9.7  A Elbow B Elbow  1.3 9.0 69 >53  A Elbow    7.1  10.3          EMG   Side Muscle Nerve Root Ins Act Fibs Psw Amp Dur Poly Recrt Int Bruna Comment  Right Abd Poll Brev Median C8-T1 Nml *3+ *3+ Nml Nml 0 *Reduced Nml   Right 1stDorInt Ulnar C8-T1 Nml Nml Nml Nml Nml 0 Nml Nml   Right PronatorTeres Median C6-7 Nml Nml Nml Nml Nml 0 Nml Nml   Right Biceps Musculocut C5-6 Nml Nml Nml Nml Nml 0 Nml Nml     Nerve Conduction Studies Anti Sensory Left/Right Comparison   Stim Site L Lat (ms) R Lat (ms) L-R Lat (ms) L Amp (V) R Amp (V) L-R Amp (%) Site1 Site2 L Vel (m/s) R Vel (m/s) L-R Vel (m/s)  Median Acr Palm Anti Sensory (2nd Digit)  30C  Wrist *5.1 *8.1 3.0 *1.2 *1.8 33.3 Wrist Palm     Palm  *5.7   0.4        Radial Anti Sensory (Base 1st Digit)  30.8C  Wrist 2.0 2.0 0.0 30.5 19.6 35.7 Wrist Base 1st Digit     Ulnar Anti Sensory (5th Digit)  31.1C  Wrist 3.1 3.1 0.0 23.5 17.3 26.4 Wrist 5th Digit 45 45 0   Motor Left/Right Comparison   Stim Site L Lat (ms) R Lat (ms) L-R Lat (ms) L Amp (mV) R Amp (mV) L-R Amp (%) Site1 Site2 L Vel (m/s) R Vel (m/s) L-R Vel (m/s)  Median Motor (Abd Poll Brev)  31.2C  Wrist *12.5 *13.8 *1.3 *3.1 *0.8 *74.2 Elbow Wrist *45 *47 2  Elbow 16.6 17.5 0.9 3.0 0.7 76.7       Ulnar Motor (Abd Dig Min)  31.4C  Wrist 2.9 2.9 0.0 9.0 10.2 11.8 B Elbow Wrist 65 62 3  B Elbow 5.5 5.8 0.3 8.3 9.7 14.4 A Elbow B Elbow 67 69 2  A Elbow 7.0 7.1 0.1 8.1 10.3 21.4          Waveforms:                     Clinical History: No specialty comments available.   She  reports that she has never smoked. She has never used smokeless tobacco. No results for input(s): HGBA1C, LABURIC in the last 8760 hours.  Objective:  VS:  HT:    WT:   BMI:     BP:   HR: bpm  TEMP: ( )  RESP:  Physical Exam Vitals and nursing note reviewed.  Constitutional:      General: She is not in acute distress.    Appearance: Normal appearance. She is well-developed. She is not ill-appearing.  HENT:     Head: Normocephalic and atraumatic.  Eyes:     Conjunctiva/sclera: Conjunctivae normal.     Pupils: Pupils are equal, round, and reactive to light.  Cardiovascular:     Rate and Rhythm: Normal rate.     Pulses: Normal pulses.  Pulmonary:     Effort: Pulmonary effort is normal.  Musculoskeletal:        General: No swelling, tenderness or deformity.     Right lower leg: No edema.     Left lower leg: No edema.     Comments: Inspection reveals flattening of the bilateral APB with some CMC joint arthritis but no atrophy of the bilateral APB or FDI or hand intrinsics. There is no swelling, color changes, allodynia or dystrophic  changes. There is 5 out of 5 strength in the bilateral wrist extension, finger abduction and long finger flexion. There is intact sensation to light touch in all dermatomal and peripheral nerve distributions. There is a positive Tinel's test at the bilateral wrist. There is a positive Phalen's test bilaterally. There is a negative Hoffmann's test bilaterally.  Skin:    General: Skin is warm and dry.     Findings: No erythema or rash.  Neurological:     General: No focal deficit present.     Mental Status: She is alert and oriented to person, place, and time.     Sensory: No sensory deficit.     Motor: No weakness or abnormal muscle tone.     Coordination: Coordination normal.     Gait: Gait normal.  Psychiatric:        Mood and Affect: Mood normal.        Behavior: Behavior normal.     Ortho Exam  Imaging: No results found.  Past  Medical/Family/Surgical/Social History: Medications & Allergies reviewed per EMR, new medications updated. Patient Active Problem List   Diagnosis Date Noted   Post-operative state 07/24/2015   Menometrorrhagia 07/11/2011   Fibroids 07/11/2011   Past Medical History:  Diagnosis Date   Anemia    Depression    Fibroids    GERD (gastroesophageal reflux disease)    Hand pain    Hepatitis    unknown type 27 years ago   Hypercholesterolemia    diet controlled cholesterol   Metrorrhagia    Muscle pain    Family History  Problem Relation Age of Onset   Hyperlipidemia Mother    Hypertension Father    Healthy Sister    Healthy Brother    Healthy Daughter    Healthy Brother    Anesthesia problems Neg Hx    Past Surgical History:  Procedure Laterality Date   ABDOMINAL HYSTERECTOMY     BILATERAL SALPINGECTOMY Bilateral 07/24/2015   Procedure: BILATERAL SALPINGECTOMY;  Surgeon: Elveria Mungo, MD;  Location: [facility];  Service: Gynecology;  Laterality: Bilateral;   CHOLECYSTECTOMY     DILATION AND CURETTAGE OF UTERUS     x 2   hyseroscopy     HYSTEROSCOPY  October 27, 2011   VAGINAL HYSTERECTOMY N/A 07/24/2015   Procedure: HYSTERECTOMY VAGINAL;  Surgeon: Elveria Mungo, MD;  Location: [facility];  Service: Gynecology;  Laterality: N/A;   Social History   Occupational History   Not on file  Tobacco Use   Smoking status: Never   Smokeless tobacco: Never  Vaping Use   Vaping status: Never Used  Substance and Sexual Activity   Alcohol use: Yes    Comment: socially   Drug use: No   Sexual activity: Yes    Birth control/protection: Surgical

## 2023-05-26 NOTE — Progress Notes (Signed)
 Functional Pain Scale - descriptive words and definitions   Severe (9)  Cannot do any ADL's even with assistance can barely talk/unable to sleep and unable to use distraction. Severe range order  Average Pain 10  BILUE NCS  Pain, numbness and tingling in both hands. Worse at right time. She has a difficult time brushing hair, teeth and putting on jewelry. Right handed.

## 2023-05-27 NOTE — Procedures (Signed)
 EMG & NCV Findings: Evaluation of the left median motor and the right median motor nerves showed prolonged distal onset latency (L12.5, R13.8 ms), reduced amplitude (L3.1, R0.8 mV), and decreased conduction velocity (Elbow-Wrist, L45, R47 m/s).  The left median (across palm) sensory nerve showed no response (Palm), prolonged distal peak latency (5.1 ms), and reduced amplitude (1.2 V).  The right median (across palm) sensory nerve showed prolonged distal peak latency (Wrist, 8.1 ms), reduced amplitude (1.8 V), and prolonged distal peak latency (Palm, 5.7 ms).  All remaining nerves (as indicated in the following tables) were within normal limits.  Left vs. Right side comparison data for the median motor nerve indicates abnormal L-R latency difference (1.3 ms) and abnormal L-R amplitude difference (74.2 %).  All remaining left vs. right side differences were within normal limits.    Needle evaluation of the right abductor pollicis brevis muscle showed increased spontaneous activity and diminished recruitment.  All remaining muscles (as indicated in the following table) showed no evidence of electrical instability.    Impression: The above electrodiagnostic study is ABNORMAL and reveals evidence of a severe bilateral R>L median nerve entrapment at the wrist (carpal tunnel syndrome) affecting sensory and motor components.   There is no significant electrodiagnostic evidence of any other focal nerve entrapment, brachial plexopathy or cervical radiculopathy.   Recommendations: 1.  Follow-up with referring physician. 2.  Continue current management of symptoms. 3.  Suggest surgical evaluation.  ___________________________ Prentice Masters FAAPMR Board Certified, American Board of Physical Medicine and Rehabilitation    Nerve Conduction Studies Anti Sensory Summary Table   Stim Site NR Peak (ms) Norm Peak (ms) P-T Amp (V) Norm P-T Amp Site1 Site2 Delta-P (ms) Dist (cm) Vel (m/s) Norm Vel (m/s)  Left  Median Acr Palm Anti Sensory (2nd Digit)  30C  Wrist    *5.1 <3.6 *1.2 >10 Wrist Palm  0.0    Palm *NR  <2.0          Right Median Acr Palm Anti Sensory (2nd Digit)  31.7C  Wrist    *8.1 <3.6 *1.8 >10 Wrist Palm 2.4 0.0    Palm    *5.7 <2.0 0.4         Left Radial Anti Sensory (Base 1st Digit)  30.8C  Wrist    2.0 <3.1 30.5  Wrist Base 1st Digit 2.0 0.0    Right Radial Anti Sensory (Base 1st Digit)  31.7C  Wrist    2.0 <3.1 19.6  Wrist Base 1st Digit 2.0 0.0    Left Ulnar Anti Sensory (5th Digit)  31.1C  Wrist    3.1 <3.7 23.5 >15.0 Wrist 5th Digit 3.1 14.0 45 >38  Right Ulnar Anti Sensory (5th Digit)  32C  Wrist    3.1 <3.7 17.3 >15.0 Wrist 5th Digit 3.1 14.0 45 >38   Motor Summary Table   Stim Site NR Onset (ms) Norm Onset (ms) O-P Amp (mV) Norm O-P Amp Site1 Site2 Delta-0 (ms) Dist (cm) Vel (m/s) Norm Vel (m/s)  Left Median Motor (Abd Poll Brev)  31.2C  Wrist    *12.5 <4.2 *3.1 >5 Elbow Wrist 4.1 18.5 *45 >50  Elbow    16.6  3.0         Right Median Motor (Abd Poll Brev)  31.9C  Wrist    *13.8 <4.2 *0.8 >5 Elbow Wrist 3.7 17.5 *47 >50  Elbow    17.5  0.7         Left Ulnar Motor (Abd Dig Min)  31.4C  Wrist    2.9 <4.2 9.0 >3 B Elbow Wrist 2.6 17.0 65 >53  B Elbow    5.5  8.3  A Elbow B Elbow 1.5 10.0 67 >53  A Elbow    7.0  8.1         Right Ulnar Motor (Abd Dig Min)  32C  Wrist    2.9 <4.2 10.2 >3 B Elbow Wrist 2.9 18.0 62 >53  B Elbow    5.8  9.7  A Elbow B Elbow 1.3 9.0 69 >53  A Elbow    7.1  10.3          EMG   Side Muscle Nerve Root Ins Act Fibs Psw Amp Dur Poly Recrt Int Bruna Comment  Right Abd Poll Brev Median C8-T1 Nml *3+ *3+ Nml Nml 0 *Reduced Nml   Right 1stDorInt Ulnar C8-T1 Nml Nml Nml Nml Nml 0 Nml Nml   Right PronatorTeres Median C6-7 Nml Nml Nml Nml Nml 0 Nml Nml   Right Biceps Musculocut C5-6 Nml Nml Nml Nml Nml 0 Nml Nml     Nerve Conduction Studies Anti Sensory Left/Right Comparison   Stim Site L Lat (ms) R Lat (ms) L-R Lat (ms) L Amp (V)  R Amp (V) L-R Amp (%) Site1 Site2 L Vel (m/s) R Vel (m/s) L-R Vel (m/s)  Median Acr Palm Anti Sensory (2nd Digit)  30C  Wrist *5.1 *8.1 3.0 *1.2 *1.8 33.3 Wrist Palm     Palm  *5.7   0.4        Radial Anti Sensory (Base 1st Digit)  30.8C  Wrist 2.0 2.0 0.0 30.5 19.6 35.7 Wrist Base 1st Digit     Ulnar Anti Sensory (5th Digit)  31.1C  Wrist 3.1 3.1 0.0 23.5 17.3 26.4 Wrist 5th Digit 45 45 0   Motor Left/Right Comparison   Stim Site L Lat (ms) R Lat (ms) L-R Lat (ms) L Amp (mV) R Amp (mV) L-R Amp (%) Site1 Site2 L Vel (m/s) R Vel (m/s) L-R Vel (m/s)  Median Motor (Abd Poll Brev)  31.2C  Wrist *12.5 *13.8 *1.3 *3.1 *0.8 *74.2 Elbow Wrist *45 *47 2  Elbow 16.6 17.5 0.9 3.0 0.7 76.7       Ulnar Motor (Abd Dig Min)  31.4C  Wrist 2.9 2.9 0.0 9.0 10.2 11.8 B Elbow Wrist 65 62 3  B Elbow 5.5 5.8 0.3 8.3 9.7 14.4 A Elbow B Elbow 67 69 2  A Elbow 7.0 7.1 0.1 8.1 10.3 21.4          Waveforms:

## 2023-06-02 ENCOUNTER — Encounter: Payer: Self-pay | Admitting: Physical Medicine and Rehabilitation

## 2023-06-09 ENCOUNTER — Ambulatory Visit (INDEPENDENT_AMBULATORY_CARE_PROVIDER_SITE_OTHER): Payer: Self-pay | Admitting: Orthopaedic Surgery

## 2023-06-09 DIAGNOSIS — G5601 Carpal tunnel syndrome, right upper limb: Secondary | ICD-10-CM | POA: Insufficient documentation

## 2023-06-09 DIAGNOSIS — G5602 Carpal tunnel syndrome, left upper limb: Secondary | ICD-10-CM | POA: Insufficient documentation

## 2023-06-09 NOTE — Progress Notes (Signed)
Office Visit Note   Patient: Diana Lewis           Date of Birth: 16-Jul-1965           MRN: [id number] Visit Date: 06/09/2023              Requested by: Claiborne Rigg, NP [address] [place],  [place] [address] PCP: Claiborne Rigg, NP   Assessment & Plan: Visit Diagnoses:  1. Right carpal tunnel syndrome   2. Left carpal tunnel syndrome     Plan: Patient is a 58 year old Hispanic female with severe bilateral carpal tunnel syndrome worse on the right.  Based on these findings I recommended surgical treatment.  She is currently self-pay and will look into the: Financial assistance program.  She will contact us back when she is ready to schedule surgery.  Interpreter present today.  Follow-Up Instructions: No follow-ups on file.   Orders:  No orders of the defined types were placed in this encounter.  No orders of the defined types were placed in this encounter.     Procedures: No procedures performed   Clinical Data: No additional findings.   Subjective: Chief Complaint  Patient presents with   Right Hand - Follow-up    EMG review   Left Hand - Follow-up    HPI Patient returns today for discussion of recent nerve conduction studies.  Interpreter present. Review of Systems   Objective: Vital Signs: LMP 07/08/2015 (Exact Date)   Physical Exam  Ortho Exam Examination of bilateral hands is unchanged. Specialty Comments:  No specialty comments available.  Imaging: No results found.   PMFS History: Patient Active Problem List   Diagnosis Date Noted   Right carpal tunnel syndrome 06/09/2023   Left carpal tunnel syndrome 06/09/2023   Post-operative state 07/24/2015   Menometrorrhagia 07/11/2011   Fibroids 07/11/2011   Past Medical History:  Diagnosis Date   Anemia    Depression    Fibroids    GERD (gastroesophageal reflux disease)    Hand pain    Hepatitis    unknown type 27 years ago   Hypercholesterolemia    diet  controlled cholesterol   Metrorrhagia    Muscle pain     Family History  Problem Relation Age of Onset   Hyperlipidemia Mother    Hypertension Father    Healthy Sister    Healthy Brother    Healthy Daughter    Healthy Brother    Anesthesia problems Neg Hx     Past Surgical History:  Procedure Laterality Date   ABDOMINAL HYSTERECTOMY     BILATERAL SALPINGECTOMY Bilateral 07/24/2015   Procedure: BILATERAL SALPINGECTOMY;  Surgeon: Willodean Rosenthal, MD;  Location: [facility];  Service: Gynecology;  Laterality: Bilateral;   CHOLECYSTECTOMY     DILATION AND CURETTAGE OF UTERUS     x 2   hyseroscopy     HYSTEROSCOPY  October 27, 2011   VAGINAL HYSTERECTOMY N/A 07/24/2015   Procedure: HYSTERECTOMY VAGINAL;  Surgeon: Willodean Rosenthal, MD;  Location: [facility];  Service: Gynecology;  Laterality: N/A;   Social History   Occupational History   Not on file  Tobacco Use   Smoking status: Never   Smokeless tobacco: Never  Vaping Use   Vaping status: Never Used  Substance and Sexual Activity   Alcohol use: Yes    Comment: socially   Drug use: No   Sexual activity: Yes    Birth control/protection: Surgical

## 2023-07-23 ENCOUNTER — Ambulatory Visit
Admission: RE | Admit: 2023-07-23 | Discharge: 2023-07-23 | Disposition: A | Discharge status: Home / Self Care | Payer: Self-pay | Source: Ambulatory Visit | Attending: Nurse Practitioner

## 2023-07-23 DIAGNOSIS — Z1231 Encounter for screening mammogram for malignant neoplasm of breast: Secondary | ICD-10-CM

## 2023-07-28 ENCOUNTER — Encounter: Payer: Self-pay | Admitting: Nurse Practitioner

## 2023-08-12 ENCOUNTER — Other Ambulatory Visit: Payer: Self-pay | Admitting: Physician Assistant

## 2023-08-12 ENCOUNTER — Ambulatory Visit (INDEPENDENT_AMBULATORY_CARE_PROVIDER_SITE_OTHER): Admitting: Orthopaedic Surgery

## 2023-08-12 ENCOUNTER — Encounter: Payer: Self-pay | Admitting: Orthopaedic Surgery

## 2023-08-12 DIAGNOSIS — G5601 Carpal tunnel syndrome, right upper limb: Secondary | ICD-10-CM

## 2023-08-12 DIAGNOSIS — G5602 Carpal tunnel syndrome, left upper limb: Secondary | ICD-10-CM

## 2023-08-12 MED ORDER — ONDANSETRON HCL 4 MG PO TABS: 4.0000 mg | ORAL_TABLET | Freq: Three times a day (TID) | ORAL | 0 refills | Status: AC | PRN

## 2023-08-12 MED ORDER — HYDROCODONE-ACETAMINOPHEN 5-325 MG PO TABS: 1.0000 | ORAL_TABLET | Freq: Three times a day (TID) | ORAL | 0 refills | Status: AC | PRN

## 2023-08-12 NOTE — Progress Notes (Signed)
 Office Visit Note   Patient: Diana Lewis           Date of Birth: 1966/04/04           MRN: [id number] Visit Date: 08/12/2023              Requested by: Collins Dean, NP [address] [place] [place],  [place] [address] PCP: Collins Dean, NP   Assessment & Plan: Visit Diagnoses:  1. Right carpal tunnel syndrome   2. Left carpal tunnel syndrome     Plan: Patient is a 58 year old female with severe bilateral carpal tunnel syndrome worse on the right.  Stephenie Einstein will speak with her today to confirm surgery date.  Detailed surgical plan including risk benefits prognosis reviewed.  Interpreter present.  Follow-Up Instructions: No follow-ups on file.   Orders:  No orders of the defined types were placed in this encounter.  No orders of the defined types were placed in this encounter.     Procedures: No procedures performed   Clinical Data: No additional findings.   Subjective: Chief Complaint  Patient presents with   Left Hand - Follow-up   Right Hand - Follow-up    HPI Diana Lewis is a 58 year old female who returns for our follow-up evaluation of bilateral carpal tunnel syndrome.  She has now been approved for [facility] financial assistance program and would like to schedule surgery.  Her last visit was February 18.  She would like to go ahead and get the right hand done since this is more symptomatic and worse on EMGs. Review of Systems  Constitutional: Negative.   HENT: Negative.    Eyes: Negative.   Respiratory: Negative.    Cardiovascular: Negative.   Endocrine: Negative.   Musculoskeletal: Negative.   Neurological: Negative.   Hematological: Negative.   Psychiatric/Behavioral: Negative.    All other systems reviewed and are negative.    Objective: Vital Signs: LMP 07/08/2015 (Exact Date)   Physical Exam Vitals and nursing note reviewed.  Constitutional:      Appearance: She is well-developed.  HENT:     Head: Atraumatic.     Nose: Nose  normal.  Eyes:     Extraocular Movements: Extraocular movements intact.  Cardiovascular:     Pulses: Normal pulses.  Pulmonary:     Effort: Pulmonary effort is normal.  Abdominal:     Palpations: Abdomen is soft.  Musculoskeletal:     Cervical back: Neck supple.  Skin:    General: Skin is warm.     Capillary Refill: Capillary refill takes less than 2 seconds.  Neurological:     Mental Status: She is alert. Mental status is at baseline.  Psychiatric:        Behavior: Behavior normal.        Thought Content: Thought content normal.        Judgment: Judgment normal.     Ortho Exam Examination of bilateral hands unchanged from prior visit. Specialty Comments:  No specialty comments available.  Imaging: No results found.   PMFS History: Patient Active Problem List   Diagnosis Date Noted   Right carpal tunnel syndrome 06/09/2023   Left carpal tunnel syndrome 06/09/2023   Post-operative state 07/24/2015   Menometrorrhagia 07/11/2011   Fibroids 07/11/2011   Past Medical History:  Diagnosis Date   Anemia    Depression    Fibroids    GERD (gastroesophageal reflux disease)    Hand pain    Hepatitis    unknown type  27 years ago   Hypercholesterolemia    diet controlled cholesterol   Metrorrhagia    Muscle pain     Family History  Problem Relation Age of Onset   Hyperlipidemia Mother    Hypertension Father    Healthy Sister    Healthy Daughter    Healthy Brother    Healthy Brother    Anesthesia problems Neg Hx    Breast cancer Neg Hx     Past Surgical History:  Procedure Laterality Date   ABDOMINAL HYSTERECTOMY     BILATERAL SALPINGECTOMY Bilateral 07/24/2015   Procedure: BILATERAL SALPINGECTOMY;  Surgeon: Lenord Radon, MD;  Location: [facility];  Service: Gynecology;  Laterality: Bilateral;   CHOLECYSTECTOMY     DILATION AND CURETTAGE OF UTERUS     x 2   hyseroscopy     HYSTEROSCOPY  October 27, 2011   VAGINAL HYSTERECTOMY N/A 07/24/2015   Procedure:  HYSTERECTOMY VAGINAL;  Surgeon: Lenord Radon, MD;  Location: [facility];  Service: Gynecology;  Laterality: N/A;   Social History   Occupational History   Not on file  Tobacco Use   Smoking status: Never   Smokeless tobacco: Never  Vaping Use   Vaping status: Never Used  Substance and Sexual Activity   Alcohol use: Yes    Comment: socially   Drug use: No   Sexual activity: Yes    Birth control/protection: Surgical

## 2023-08-13 ENCOUNTER — Other Ambulatory Visit: Payer: Self-pay

## 2023-08-13 ENCOUNTER — Encounter (HOSPITAL_BASED_OUTPATIENT_CLINIC_OR_DEPARTMENT_OTHER): Payer: Self-pay | Admitting: Orthopaedic Surgery

## 2023-08-17 ENCOUNTER — Encounter (HOSPITAL_BASED_OUTPATIENT_CLINIC_OR_DEPARTMENT_OTHER)
Admission: RE | Admit: 2023-08-17 | Discharge: 2023-08-17 | Disposition: A | Discharge status: Home / Self Care | Payer: Self-pay | Source: Ambulatory Visit | Attending: Orthopaedic Surgery | Admitting: Orthopaedic Surgery

## 2023-08-17 DIAGNOSIS — K759 Inflammatory liver disease, unspecified: Secondary | ICD-10-CM | POA: Insufficient documentation

## 2023-08-17 DIAGNOSIS — Z01812 Encounter for preprocedural laboratory examination: Secondary | ICD-10-CM | POA: Insufficient documentation

## 2023-08-17 LAB — COMPREHENSIVE METABOLIC PANEL WITH GFR
ALT: 79 U/L — ABNORMAL HIGH (ref 0–44)
AST: 53 U/L — ABNORMAL HIGH (ref 15–41)
Albumin: 4 g/dL (ref 3.5–5.0)
Alkaline Phosphatase: 88 U/L (ref 38–126)
Anion gap: 11 (ref 5–15)
BUN: 14 mg/dL (ref 6–20)
CO2: 22 mmol/L (ref 22–32)
Calcium: 9.7 mg/dL (ref 8.9–10.3)
Chloride: 104 mmol/L (ref 98–111)
Creatinine, Ser: 0.62 mg/dL (ref 0.44–1.00)
GFR, Estimated: >60 mL/min (ref >60)
Glucose, Bld: 168 mg/dL — ABNORMAL HIGH (ref 70–99)
Potassium: 3.8 mmol/L (ref 3.5–5.1)
Sodium: 137 mmol/L (ref 135–145)
Total Bilirubin: 0.6 mg/dL (ref 0.0–1.2)
Total Protein: 7.4 g/dL (ref 6.5–8.1)

## 2023-08-17 NOTE — Progress Notes (Signed)

## 2023-08-19 ENCOUNTER — Encounter (HOSPITAL_BASED_OUTPATIENT_CLINIC_OR_DEPARTMENT_OTHER): Payer: Self-pay | Admitting: Orthopaedic Surgery

## 2023-08-19 ENCOUNTER — Ambulatory Visit (HOSPITAL_BASED_OUTPATIENT_CLINIC_OR_DEPARTMENT_OTHER)
Admission: RE | Admit: 2023-08-19 | Discharge: 2023-08-19 | Disposition: A | Discharge status: Home / Self Care | Payer: Self-pay | Attending: Orthopaedic Surgery | Admitting: Orthopaedic Surgery

## 2023-08-19 ENCOUNTER — Ambulatory Visit (HOSPITAL_BASED_OUTPATIENT_CLINIC_OR_DEPARTMENT_OTHER): Payer: Self-pay | Admitting: Anesthesiology

## 2023-08-19 ENCOUNTER — Encounter (HOSPITAL_BASED_OUTPATIENT_CLINIC_OR_DEPARTMENT_OTHER)
Admission: RE | Disposition: A | Discharge status: Home / Self Care | Payer: Self-pay | Source: Home / Self Care | Attending: Orthopaedic Surgery

## 2023-08-19 ENCOUNTER — Other Ambulatory Visit: Payer: Self-pay

## 2023-08-19 DIAGNOSIS — F419 Anxiety disorder, unspecified: Secondary | ICD-10-CM | POA: Insufficient documentation

## 2023-08-19 DIAGNOSIS — G5601 Carpal tunnel syndrome, right upper limb: Secondary | ICD-10-CM

## 2023-08-19 DIAGNOSIS — K219 Gastro-esophageal reflux disease without esophagitis: Secondary | ICD-10-CM | POA: Insufficient documentation

## 2023-08-19 DIAGNOSIS — G709 Myoneural disorder, unspecified: Secondary | ICD-10-CM | POA: Insufficient documentation

## 2023-08-19 DIAGNOSIS — K759 Inflammatory liver disease, unspecified: Secondary | ICD-10-CM

## 2023-08-19 HISTORY — DX: Anxiety disorder, unspecified: F41.9

## 2023-08-19 SURGERY — CARPAL TUNNEL RELEASE
Anesthesia: Monitor Anesthesia Care | Site: Wrist | Laterality: Right

## 2023-08-19 MED ORDER — CELECOXIB 200 MG PO CAPS
200.0000 mg | ORAL_CAPSULE | Freq: Once | ORAL | Status: AC
Start: 2023-08-19 — End: 2023-08-19
  Administered 2023-08-19: 200 mg via ORAL

## 2023-08-19 MED ORDER — LIDOCAINE-EPINEPHRINE (PF) 1 %-1:200000 IJ SOLN
INTRAMUSCULAR | Status: DC | PRN
  Administered 2023-08-19: 5 mL

## 2023-08-19 MED ORDER — SODIUM CHLORIDE 0.9 % IV SOLN
12.5000 mg | INTRAVENOUS | Status: DC | PRN
  Filled 2023-08-19: qty 0.5

## 2023-08-19 MED ORDER — FENTANYL CITRATE (PF) 100 MCG/2ML IJ SOLN: 25.0000 ug | INTRAMUSCULAR | Status: DC | PRN

## 2023-08-19 MED ORDER — PROPOFOL 10 MG/ML IV BOLUS
INTRAVENOUS | Status: AC
  Filled 2023-08-19: qty 20

## 2023-08-19 MED ORDER — FENTANYL CITRATE (PF) 100 MCG/2ML IJ SOLN
INTRAMUSCULAR | Status: AC
  Filled 2023-08-19: qty 2

## 2023-08-19 MED ORDER — OXYCODONE HCL 5 MG PO TABS: 5.0000 mg | ORAL_TABLET | Freq: Once | ORAL | Status: DC | PRN

## 2023-08-19 MED ORDER — BUPIVACAINE HCL (PF) 0.25 % IJ SOLN
INTRAMUSCULAR | Status: AC
  Filled 2023-08-19: qty 30

## 2023-08-19 MED ORDER — 0.9 % SODIUM CHLORIDE (POUR BTL) OPTIME
TOPICAL | Status: DC | PRN
  Administered 2023-08-19: 1000 mL

## 2023-08-19 MED ORDER — MIDAZOLAM HCL 5 MG/5ML IJ SOLN
INTRAMUSCULAR | Status: DC | PRN
  Administered 2023-08-19: 2 mg via INTRAVENOUS

## 2023-08-19 MED ORDER — LIDOCAINE-EPINEPHRINE 1 %-1:100000 IJ SOLN
INTRAMUSCULAR | Status: AC
  Filled 2023-08-19: qty 1

## 2023-08-19 MED ORDER — LACTATED RINGERS IV SOLN: INTRAVENOUS | Status: DC

## 2023-08-19 MED ORDER — CEFAZOLIN SODIUM-DEXTROSE 2-4 GM/100ML-% IV SOLN
2.0000 g | INTRAVENOUS | Status: AC
Start: 2023-08-19 — End: 2023-08-19
  Administered 2023-08-19: 2 g via INTRAVENOUS

## 2023-08-19 MED ORDER — ACETAMINOPHEN 500 MG PO TABS
1000.0000 mg | ORAL_TABLET | Freq: Once | ORAL | Status: AC
  Administered 2023-08-19: 1000 mg via ORAL

## 2023-08-19 MED ORDER — LIDOCAINE 2% (20 MG/ML) 5 ML SYRINGE
INTRAMUSCULAR | Status: AC
  Filled 2023-08-19: qty 5

## 2023-08-19 MED ORDER — ACETAMINOPHEN 500 MG PO TABS
ORAL_TABLET | ORAL | Status: AC
  Filled 2023-08-19: qty 2

## 2023-08-19 MED ORDER — BUPIVACAINE HCL (PF) 0.25 % IJ SOLN
INTRAMUSCULAR | Status: DC | PRN
  Administered 2023-08-19: 5 mL

## 2023-08-19 MED ORDER — LIDOCAINE HCL (CARDIAC) PF 100 MG/5ML IV SOSY
PREFILLED_SYRINGE | INTRAVENOUS | Status: DC | PRN
  Administered 2023-08-19: 60 mg via INTRAVENOUS

## 2023-08-19 MED ORDER — LIDOCAINE-EPINEPHRINE (PF) 1 %-1:200000 IJ SOLN
INTRAMUSCULAR | Status: AC
  Filled 2023-08-19: qty 30

## 2023-08-19 MED ORDER — ONDANSETRON HCL 4 MG/2ML IJ SOLN
INTRAMUSCULAR | Status: DC | PRN
  Administered 2023-08-19: 4 mg via INTRAVENOUS

## 2023-08-19 MED ORDER — DEXAMETHASONE SODIUM PHOSPHATE 4 MG/ML IJ SOLN
INTRAMUSCULAR | Status: DC | PRN
  Administered 2023-08-19: 5 mg via INTRAVENOUS

## 2023-08-19 MED ORDER — OXYCODONE HCL 5 MG/5ML PO SOLN: 5.0000 mg | Freq: Once | ORAL | Status: DC | PRN

## 2023-08-19 MED ORDER — CEFAZOLIN SODIUM-DEXTROSE 2-4 GM/100ML-% IV SOLN
INTRAVENOUS | Status: AC
  Filled 2023-08-19: qty 100

## 2023-08-19 MED ORDER — MIDAZOLAM HCL 2 MG/2ML IJ SOLN
INTRAMUSCULAR | Status: AC
  Filled 2023-08-19: qty 2

## 2023-08-19 MED ORDER — ONDANSETRON HCL 4 MG/2ML IJ SOLN
INTRAMUSCULAR | Status: AC
  Filled 2023-08-19: qty 2

## 2023-08-19 MED ORDER — PROPOFOL 500 MG/50ML IV EMUL
INTRAVENOUS | Status: DC | PRN
  Administered 2023-08-19: 75 ug/kg/min via INTRAVENOUS

## 2023-08-19 MED ORDER — PROPOFOL 10 MG/ML IV BOLUS
INTRAVENOUS | Status: DC | PRN
  Administered 2023-08-19 (×2): 10 mg via INTRAVENOUS

## 2023-08-19 MED ORDER — FENTANYL CITRATE (PF) 100 MCG/2ML IJ SOLN
INTRAMUSCULAR | Status: DC | PRN
  Administered 2023-08-19 (×4): 25 ug via INTRAVENOUS

## 2023-08-19 MED ORDER — AMISULPRIDE (ANTIEMETIC) 5 MG/2ML IV SOLN: 10.0000 mg | Freq: Once | INTRAVENOUS | Status: DC | PRN

## 2023-08-19 MED ORDER — CELECOXIB 200 MG PO CAPS
ORAL_CAPSULE | ORAL | Status: AC
  Filled 2023-08-19: qty 1

## 2023-08-19 SURGICAL SUPPLY — 37 items
BAND RUBBER #18 3X1/16 STRL (MISCELLANEOUS) ×4 IMPLANT
BLADE MINI RND TIP GREEN BEAV (BLADE) ×2 IMPLANT
BLADE SURG 15 STRL LF DISP TIS (BLADE) ×2 IMPLANT
BNDG ELASTIC 3INX 5YD STR LF (GAUZE/BANDAGES/DRESSINGS) ×2 IMPLANT
BNDG ESMARK 4X9 LF (GAUZE/BANDAGES/DRESSINGS) ×2 IMPLANT
BRUSH SCRUB EZ PLAIN DRY (MISCELLANEOUS) ×2 IMPLANT
CORD BIPOLAR FORCEPS 12FT (ELECTRODE) ×2 IMPLANT
COVER BACK TABLE 60X90IN (DRAPES) ×2 IMPLANT
CUFF TOURN SGL QUICK 18X4 (TOURNIQUET CUFF) ×2 IMPLANT
DRAPE EXTREMITY T 121X128X90 (DISPOSABLE) ×2 IMPLANT
DRAPE SURG 17X23 STRL (DRAPES) ×2 IMPLANT
DRAPE U-SHAPE 47X51 STRL (DRAPES) ×2 IMPLANT
DURAPREP 26ML APPLICATOR (WOUND CARE) ×2 IMPLANT
GAUZE SPONGE 4X4 12PLY STRL (GAUZE/BANDAGES/DRESSINGS) ×2 IMPLANT
GAUZE XEROFORM 1X8 LF (GAUZE/BANDAGES/DRESSINGS) ×2 IMPLANT
GLOVE BIOGEL PI IND STRL 6.5 (GLOVE) IMPLANT
GLOVE BIOGEL PI IND STRL 7.5 (GLOVE) ×2 IMPLANT
GLOVE ECLIPSE 7.0 STRL STRAW (GLOVE) ×2 IMPLANT
GLOVE INDICATOR 7.0 STRL GRN (GLOVE) ×2 IMPLANT
GLOVE SURG SS PI 6.5 STRL IVOR (GLOVE) IMPLANT
GLOVE SURG SYN 7.5 E (GLOVE) ×2 IMPLANT
GLOVE SURG SYN 7.5 PF PI (GLOVE) ×4 IMPLANT
GOWN STRL REUS W/ TWL LRG LVL3 (GOWN DISPOSABLE) ×2 IMPLANT
GOWN STRL SURGICAL XL XLNG (GOWN DISPOSABLE) ×4 IMPLANT
NDL HYPO 25X1 1.5 SAFETY (NEEDLE) ×2 IMPLANT
NEEDLE HYPO 25X1 1.5 SAFETY (NEEDLE) ×1 IMPLANT
NS IRRIG 1000ML POUR BTL (IV SOLUTION) ×2 IMPLANT
PACK BASIN DAY SURGERY FS (CUSTOM PROCEDURE TRAY) ×2 IMPLANT
SHEET MEDIUM DRAPE 40X70 STRL (DRAPES) ×2 IMPLANT
SOL PREP POV-IOD 4OZ 10% (MISCELLANEOUS) IMPLANT
STOCKINETTE 4X48 STRL (DRAPES) ×2 IMPLANT
SUT ETHILON 3 0 PS 1 (SUTURE) ×2 IMPLANT
SYR BULB EAR ULCER 3OZ GRN STR (SYRINGE) ×2 IMPLANT
SYR CONTROL 10ML LL (SYRINGE) IMPLANT
TOWEL GREEN STERILE FF (TOWEL DISPOSABLE) ×2 IMPLANT
TRAY DSU PREP LF (CUSTOM PROCEDURE TRAY) ×2 IMPLANT
UNDERPAD 30X36 HEAVY ABSORB (UNDERPADS AND DIAPERS) ×2 IMPLANT

## 2023-08-19 NOTE — H&P (Signed)
 PREOPERATIVE H&P  Chief Complaint: right carpal tunnel syndrome  HPI: Diana Lewis is a 58 y.o. female who presents for surgical treatment of right carpal tunnel syndrome.  She denies any changes in medical history.  Past Surgical History:  Procedure Laterality Date   ABDOMINAL HYSTERECTOMY     BILATERAL SALPINGECTOMY Bilateral 07/24/2015   Procedure: BILATERAL SALPINGECTOMY;  Surgeon: Lenord Radon, MD;  Location: [facility];  Service: Gynecology;  Laterality: Bilateral;   CHOLECYSTECTOMY     DILATION AND CURETTAGE OF UTERUS     x 2   hyseroscopy     HYSTEROSCOPY  October 27, 2011   VAGINAL HYSTERECTOMY N/A 07/24/2015   Procedure: HYSTERECTOMY VAGINAL;  Surgeon: Lenord Radon, MD;  Location: [facility];  Service: Gynecology;  Laterality: N/A;   Social History   Socioeconomic History   Marital status: Single    Spouse name: Not on file   Number of children: 1   Years of education: Not on file   Highest education level: 8th grade  Occupational History   Not on file  Tobacco Use   Smoking status: Never   Smokeless tobacco: Never  Vaping Use   Vaping status: Never Used  Substance and Sexual Activity   Alcohol use: Yes    Comment: socially   Drug use: No   Sexual activity: Yes    Birth control/protection: Surgical  Other Topics Concern   Not on file  Social History Narrative   Patient's father passed away on 12-16-19 due to COVID-19.   Social Drivers of Corporate investment banker Strain: Not on file  Food Insecurity: Not on file  Transportation Needs: No Transportation Needs (12/29/2019)   PRAPARE - Administrator, Civil Service (Medical): No    Lack of Transportation (Non-Medical): No  Physical Activity: Not on file  Stress: Not on file  Social Connections: Not on file   Family History  Problem Relation Age of Onset   Hyperlipidemia Mother    Hypertension Father    Healthy Sister    Healthy Daughter    Healthy Brother     Healthy Brother    Anesthesia problems Neg Hx    Breast cancer Neg Hx    No Known Allergies Prior to Admission medications   Medication Sig Start Date End Date Taking? Authorizing Provider  diazepam (VALIUM) 2 MG tablet Take 2 mg by mouth daily as needed for anxiety.   Yes [provider]  gabapentin  (NEURONTIN ) 300 MG capsule Take 1 capsule (300 mg total) by mouth 3 (three) times daily. 04/24/23  Yes Fleming, Zelda W, NP  ibuprofen  (ADVIL ) 800 MG tablet Take 1 tablet (800 mg total) by mouth every 8 (eight) hours as needed. 02/26/19  Yes Fleming, Zelda W, NP  HYDROcodone -acetaminophen  (NORCO/VICODIN) 5-325 MG tablet Take 1 tablet by mouth 3 (three) times daily as needed for moderate pain (pain score 4-6). To be taken after surgery 08/12/23   Sandie Cross, PA-C  ondansetron  (ZOFRAN ) 4 MG tablet Take 1 tablet (4 mg total) by mouth every 8 (eight) hours as needed for nausea or vomiting. 08/12/23   Sandie Cross, PA-C     Positive ROS: All other systems have been reviewed and were otherwise negative with the exception of those mentioned in the HPI and as above.  Physical Exam: General: Alert, no acute distress Cardiovascular: No pedal edema Respiratory: No cyanosis, no use of accessory musculature GI: abdomen soft Skin: No lesions in the area of chief  complaint Neurologic: Sensation intact distally Psychiatric: Patient is competent for consent with normal mood and affect Lymphatic: no lymphedema  MUSCULOSKELETAL: exam stable  Assessment: right carpal tunnel syndrome  Plan: Plan for Procedure(s): CARPAL TUNNEL RELEASE  The risks benefits and alternatives were discussed with the patient including but not limited to the risks of nonoperative treatment, versus surgical intervention including infection, bleeding, nerve injury,  blood clots, cardiopulmonary complications, morbidity, mortality, among others, and they were willing to proceed.   Claria Crofts, MD 08/19/2023 10:26  AM

## 2023-08-19 NOTE — Discharge Instructions (Addendum)
 Postoperative instructions:  Weightbearing instructions: don't lift more than 10 lbs for 4 weeks  Dressing instructions: Keep your dressing and/or splint clean and dry at all times.  It will be removed at your first post-operative appointment.  Your stitches and/or staples will be removed at this visit.  Incision instructions:  Do not soak your incision for 3 weeks after surgery.  If the incision gets wet, pat dry and do not scrub the incision.  Pain control:  You have been given a prescription to be taken as directed for post-operative pain control.  In addition, elevate the operative extremity above the heart at all times to prevent swelling and throbbing pain.  Take over-the-counter Colace, 100mg  by mouth twice a day while taking narcotic pain medications to help prevent constipation.  Follow up appointments: 1) 10 days for suture removal and wound check. 2) Dr. Christiane Cowing as scheduled.   -------------------------------------------------------------------------------------------------------------  After Surgery Pain Control:  After your surgery, post-surgical discomfort or pain is likely. This discomfort can last several days to a few weeks. At certain times of the day your discomfort may be more intense.  Did you receive a nerve block?  A nerve block can provide pain relief for one hour to two days after your surgery. As long as the nerve block is working, you will experience little or no sensation in the area the surgeon operated on.  As the nerve block wears off, you will begin to experience pain or discomfort. It is very important that you begin taking your prescribed pain medication before the nerve block fully wears off. Treating your pain at the first sign of the block wearing off will ensure your pain is better controlled and more tolerable when full-sensation returns. Do not wait until the pain is intolerable, as the medicine will be less effective. It is better to treat pain in  advance than to try and catch up.  General Anesthesia:  If you did not receive a nerve block during your surgery, you will need to start taking your pain medication shortly after your surgery and should continue to do so as prescribed by your surgeon.  Pain Medication:  Most commonly we prescribe Vicodin and Percocet for post-operative pain. Both of these medications contain a combination of acetaminophen  (Tylenol ) and a narcotic to help control pain.   It takes between 30 and 45 minutes before pain medication starts to work. It is important to take your medication before your pain level gets too intense.   Nausea is a common side effect of many pain medications. You will want to eat something before taking your pain medicine to help prevent nausea.   If you are taking a prescription pain medication that contains acetaminophen , we recommend that you do not take additional over the counter acetaminophen  (Tylenol ).  Other pain relieving options:   Using a cold pack to ice the affected area a few times a day (15 to 20 minutes at a time) can help to relieve pain, reduce swelling and bruising.   Elevation of the affected area can also help to reduce pain and swelling.  Per [facility] clinic policy, our goal is ensure optimal postoperative pain control with a multimodal pain management strategy. For all [facility] patients, our goal is to wean post-operative narcotic medications by 6 weeks post-operatively. If this is not possible due to utilization of pain medication prior to surgery, your [facility] doctor will support your acute post-operative pain control for the first 6 weeks postoperatively, with a plan  to transition you back to your primary pain team following that. Max Spain will work to ensure a Therapist, occupational.  May take tylenol  after 4:30p if needed. Do not exceed 3000mg  tylenol  in 24 hour period. (1000 mg taken at 10:36am today.) May take ibuprofen /aleve  after 6:30 pm as needed.   Post  Anesthesia Home Care Instructions  Activity: Get plenty of rest for the remainder of the day. A responsible individual must stay with you for 24 hours following the procedure.  For the next 24 hours, DO NOT: -Drive a car -Advertising copywriter -Drink alcoholic beverages -Take any medication unless instructed by your physician -Make any legal decisions or sign important papers.  Meals: Start with liquid foods such as gelatin or soup. Progress to regular foods as tolerated. Avoid greasy, spicy, heavy foods. If nausea and/or vomiting occur, drink only clear liquids until the nausea and/or vomiting subsides. Call your physician if vomiting continues.  Special Instructions/Symptoms: Your throat may feel dry or sore from the anesthesia or the breathing tube placed in your throat during surgery. If this causes discomfort, gargle with warm salt water. The discomfort should disappear within 24 hours.  If you had a scopolamine  patch placed behind your ear for the management of post- operative nausea and/or vomiting:  1. The medication in the patch is effective for 72 hours, after which it should be removed.  Wrap patch in a tissue and discard in the trash. Wash hands thoroughly with soap and water. 2. You may remove the patch earlier than 72 hours if you experience unpleasant side effects which may include dry mouth, dizziness or visual disturbances. 3. Avoid touching the patch. Wash your hands with soap and water after contact with the patch.

## 2023-08-19 NOTE — Op Note (Signed)
   Carpal tunnel op note  DATE OF SURGERY:08/19/2023  PREOPERATIVE DIAGNOSIS:  Right carpal tunnel syndrome  POSTOPERATIVE DIAGNOSIS: same  PROCEDURE: Right carpal tunnel release. CPT 16109  SURGEON: Edison Gore, M.D.  ASSIST: Sharran Decent, [place]  ANESTHESIA:  Local and MAC  TOURNIQUET TIME: less than 20 minutes  BLOOD LOSS: Minimal.  COMPLICATIONS: None.  PATHOLOGY: None.  INDICATIONS: The patient is a 58 y.o. -year-old female who presented with carpal tunnel syndrome failing nonsurgical management, indicated for surgical release.  DESCRIPTION OF PROCEDURE: The patient was identified in the preoperative holding area.  The operative site was marked by the surgeon and confirmed by the patient.  The patient was brought back to the operating room.  MAC anesthesia was administered.  Local anesthetic with epi was injected into the operative site.  A well padded nonsterile tourniquet was placed. The operative extremity was prepped and draped in standard sterile fashion.  A timeout was performed.  Preoperative antibiotics were given.   A palmar incision was made about 5 mm ulnar to the thenar crease.  The palmar aponeurosis was exposed and divided in line with the skin incision. The palmaris brevis was visualized and divided.  The distal edge of the transcarpal ligament was identified. A hemostat was inserted into the carpal tunnel to protect the median nerve and the flexor tendons. Then, the transverse carpal ligament was released under direct visualization. Proximally, a subcutaneous tunnel was made allowing a Sewell retractor to be placed. Then, the distal portion of the antebrachial fascia was released. Distally, all fibrous bands were released. The median nerve was visualized, and the fat pad was exposed. Wound was irrigated and closed with 3-0 nylon sutures. Sterile dressing applied. The patient was transferred to the recovery room in stable condition after all counts were  correct.  POSTOPERATIVE PLAN: To start nerve gliding exercises as tolerated and no heavy lifting for four weeks.  Claria Crofts, M.D. [facility] 12:40 PM

## 2023-08-19 NOTE — Anesthesia Postprocedure Evaluation (Signed)
 Anesthesia Post Note  Patient: Diana Lewis  Procedure(s) Performed: CARPAL TUNNEL RELEASE (Right: Wrist)     Patient location during evaluation: PACU Anesthesia Type: MAC Level of consciousness: awake and alert Pain management: pain level controlled Vital Signs Assessment: post-procedure vital signs reviewed and stable Respiratory status: spontaneous breathing, nonlabored ventilation and respiratory function stable Cardiovascular status: blood pressure returned to baseline and stable Postop Assessment: no apparent nausea or vomiting Anesthetic complications: no   No notable events documented.  Last Vitals:  Vitals:   08/19/23 1311 08/19/23 1326  BP:  117/80  Pulse: 83 70  Resp: (!) 21 16  Temp:  (!) 36.4 C  SpO2: 95% 95%    Last Pain:  Vitals:   08/19/23 1326  TempSrc: Temporal  PainSc: 0-No pain                 Earvin Goldberg

## 2023-08-19 NOTE — Transfer of Care (Signed)
 Immediate Anesthesia Transfer of Care Note  Patient: Diana Lewis  Procedure(s) Performed: Procedure(s) (LRB): CARPAL TUNNEL RELEASE (Right)  Patient Location: PACU  Anesthesia Type:MAC  Level of Consciousness: awake, sedated, patient cooperative and responds to stimulation, sleepy stable   Airway & Oxygen Therapy: Patient Spontanous Breathing and Patient on RA - awake stable   Post-op Assessment: Report given to PACU RN, Post -op Vital signs reviewed and stable and Patient sleepy   Post vital signs: Reviewed and stable  Complications: No apparent anesthesia complications

## 2023-08-19 NOTE — Anesthesia Preprocedure Evaluation (Addendum)
 Anesthesia Evaluation  Patient identified by MRN, date of birth, ID band Patient awake    Reviewed: Allergy & Precautions, NPO status , Patient's Chart, lab work & pertinent test results  History of Anesthesia Complications Negative for: history of anesthetic complications  Airway Mallampati: III  TM Distance: >3 FB Neck ROM: Full    Dental  (+) Dental Advisory Given, Chipped   Pulmonary neg pulmonary ROS   Pulmonary exam normal        Cardiovascular negative cardio ROS Normal cardiovascular exam     Neuro/Psych  PSYCHIATRIC DISORDERS Anxiety Depression     Neuromuscular disease    GI/Hepatic ,GERD  Controlled,,(+) Hepatitis -  Endo/Other  negative endocrine ROS    Renal/GU negative Renal ROS     Musculoskeletal negative musculoskeletal ROS (+)    Abdominal   Peds  Hematology negative hematology ROS (+)   Anesthesia Other Findings   Reproductive/Obstetrics  s/p hysterectomy                              Anesthesia Physical Anesthesia Plan  ASA: 2  Anesthesia Plan: MAC   Post-op Pain Management: Tylenol  PO (pre-op)* and Celebrex PO (pre-op)*   Induction:   PONV Risk Score and Plan: 2 and Propofol  infusion and Treatment may vary due to age or medical condition  Airway Management Planned: Natural Airway and Simple Face Mask  Additional Equipment: None  Intra-op Plan:   Post-operative Plan:   Informed Consent: I have reviewed the patients History and Physical, chart, labs and discussed the procedure including the risks, benefits and alternatives for the proposed anesthesia with the patient or authorized representative who has indicated his/her understanding and acceptance.     Interpreter used for interview  Plan Discussed with: CRNA and Anesthesiologist  Anesthesia Plan Comments:        Anesthesia Quick Evaluation

## 2023-08-19 NOTE — Anesthesia Procedure Notes (Signed)
 Procedure Name: MAC Date/Time: 08/19/2023 12:20 PM  Performed by: Lucky Sable, CRNAPre-anesthesia Checklist: Timeout performed, Patient being monitored, Suction available, Emergency Drugs available and Patient identified Patient Re-evaluated:Patient Re-evaluated prior to induction Oxygen Delivery Method: Simple face mask Preoxygenation: Pre-oxygenation with 100% oxygen Induction Type: IV induction Placement Confirmation: breath sounds checked- equal and bilateral, CO2 detector and positive ETCO2

## 2023-08-20 ENCOUNTER — Encounter (HOSPITAL_BASED_OUTPATIENT_CLINIC_OR_DEPARTMENT_OTHER): Payer: Self-pay | Admitting: Orthopaedic Surgery

## 2023-08-26 ENCOUNTER — Ambulatory Visit (INDEPENDENT_AMBULATORY_CARE_PROVIDER_SITE_OTHER): Payer: Self-pay | Admitting: Physician Assistant

## 2023-08-26 DIAGNOSIS — G5601 Carpal tunnel syndrome, right upper limb: Secondary | ICD-10-CM

## 2023-08-26 DIAGNOSIS — Z9889 Other specified postprocedural states: Secondary | ICD-10-CM

## 2023-08-26 NOTE — Progress Notes (Signed)
 Post-Op Visit Note   Patient: Diana Lewis           Date of Birth: Mar 23, 1966           MRN: [id number] Visit Date: 08/26/2023 PCP: Collins Dean, NP   Assessment & Plan:  Chief Complaint:  Chief Complaint  Patient presents with   Right Wrist - Follow-up    Right carpal tunnel release 08/19/2023   Visit Diagnoses:  1. Right carpal tunnel syndrome   2. S/P carpal tunnel release     Plan: Patient is a pleasant 58 year old Spanish-speaking female here today with interpreter.  She is 1 week status post right carpal tunnel release 08/19/2023.  This was for severe compression.  She has been doing okay.  She has been primarily taking Tylenol  and ibuprofen  with occasional Norco at night.  She still has some paresthesias primarily to the thumb.  Examination of the right hand reveals a well-healing surgical incision with nylon sutures in place.  No evidence of infection or cellulitis.  Fingers warm well-perfused.  She does have decreased sensation to the thumb, index, long and ring fingertips.  Today, her wound was cleaned and recovered.  Velcro splint applied.  She will wear this at all times other than with hygiene.  She will begin nerve gliding exercises.  No heavy lifting or submerging her hand underwater until she is 4 weeks postop.  Follow-up next week for suture removal.  Call with concerns or questions.  Follow-Up Instructions: Return in about 1 week (around 09/02/2023).   Orders:  No orders of the defined types were placed in this encounter.  No orders of the defined types were placed in this encounter.   Imaging: No new imaging  PMFS History: Patient Active Problem List   Diagnosis Date Noted   Right carpal tunnel syndrome 06/09/2023   Left carpal tunnel syndrome 06/09/2023   Post-operative state 07/24/2015   Menometrorrhagia 07/11/2011   Fibroids 07/11/2011   Past Medical History:  Diagnosis Date   Anemia    Anxiety    Depression    Fibroids    GERD  (gastroesophageal reflux disease)    Hand pain    Hepatitis    unknown type 27 years ago   Hypercholesterolemia    diet controlled cholesterol   Metrorrhagia    Muscle pain     Family History  Problem Relation Age of Onset   Hyperlipidemia Mother    Hypertension Father    Healthy Sister    Healthy Daughter    Healthy Brother    Healthy Brother    Anesthesia problems Neg Hx    Breast cancer Neg Hx     Past Surgical History:  Procedure Laterality Date   ABDOMINAL HYSTERECTOMY     BILATERAL SALPINGECTOMY Bilateral 07/24/2015   Procedure: BILATERAL SALPINGECTOMY;  Surgeon: Lenord Radon, MD;  Location: [facility];  Service: Gynecology;  Laterality: Bilateral;   CARPAL TUNNEL RELEASE Right 08/19/2023   Procedure: CARPAL TUNNEL RELEASE;  Surgeon: Wes Hamman, MD;  Location: [facility];  Service: Orthopedics;  Laterality: Right;   CHOLECYSTECTOMY     DILATION AND CURETTAGE OF UTERUS     x 2   hyseroscopy     HYSTEROSCOPY  October 27, 2011   VAGINAL HYSTERECTOMY N/A 07/24/2015   Procedure: HYSTERECTOMY VAGINAL;  Surgeon: Lenord Radon, MD;  Location: [facility];  Service: Gynecology;  Laterality: N/A;   Social History   Occupational History   Not on file  Tobacco  Use   Smoking status: Never   Smokeless tobacco: Never  Vaping Use   Vaping status: Never Used  Substance and Sexual Activity   Alcohol use: Yes    Comment: socially   Drug use: No   Sexual activity: Yes    Birth control/protection: Surgical

## 2023-09-02 ENCOUNTER — Encounter: Payer: Self-pay | Admitting: Physician Assistant

## 2023-09-02 ENCOUNTER — Ambulatory Visit: Payer: Self-pay | Admitting: Physician Assistant

## 2023-09-02 DIAGNOSIS — Z9889 Other specified postprocedural states: Secondary | ICD-10-CM

## 2023-09-02 DIAGNOSIS — G5601 Carpal tunnel syndrome, right upper limb: Secondary | ICD-10-CM

## 2023-09-02 MED ORDER — TRAMADOL HCL 50 MG PO TABS: 50.0000 mg | ORAL_TABLET | Freq: Two times a day (BID) | ORAL | 1 refills | Status: AC | PRN

## 2023-09-02 NOTE — Progress Notes (Signed)
 Post-Op Visit Note   Patient: Diana Lewis           Date of Birth: 1965-11-08           MRN: [id number] Visit Date: 09/02/2023 PCP: Collins Dean, NP   Assessment & Plan:  Chief Complaint:  Chief Complaint  Patient presents with   Right Wrist - Follow-up    Right carpal tunnel release 08/19/2023   Visit Diagnoses:  1. Right carpal tunnel syndrome   2. S/P carpal tunnel release     Plan: Patient is a pleasant 58 year old Spanish-speaking female who is here today with interpreter.  She is 2 weeks status post right carpal tunnel release 08/19/2023 for severe carpal tunnel syndrome.  She still has some discomfort mostly at night.  She still has paresthesias throughout the median nerve distribution.  Examination of her right hand reveals a well-healed surgical incision with nylon sutures in place.  No evidence of infection or cellulitis.  Fingers warm well-perfused.  She does have decreased sensation throughout the median nerve distribution.  Today sutures were removed and Steri-Strips applied.  No heavy lifting or submerging her hand underwater for another 2 weeks.  She may continue with nerve gliding exercises.  Follow-up in 2 weeks for recheck.  Call with concerns or questions.  Follow-Up Instructions: Return in about 2 weeks (around 09/16/2023).   Orders:  No orders of the defined types were placed in this encounter.  Meds ordered this encounter  Medications   traMADol (ULTRAM) 50 MG tablet    Sig: Take 1 tablet (50 mg total) by mouth 2 (two) times daily as needed.    Dispense:  30 tablet    Refill:  1    Imaging: No new imaging  PMFS History: Patient Active Problem List   Diagnosis Date Noted   Right carpal tunnel syndrome 06/09/2023   Left carpal tunnel syndrome 06/09/2023   Post-operative state 07/24/2015   Menometrorrhagia 07/11/2011   Fibroids 07/11/2011   Past Medical History:  Diagnosis Date   Anemia    Anxiety    Depression    Fibroids    GERD  (gastroesophageal reflux disease)    Hand pain    Hepatitis    unknown type 27 years ago   Hypercholesterolemia    diet controlled cholesterol   Metrorrhagia    Muscle pain     Family History  Problem Relation Age of Onset   Hyperlipidemia Mother    Hypertension Father    Healthy Sister    Healthy Daughter    Healthy Brother    Healthy Brother    Anesthesia problems Neg Hx    Breast cancer Neg Hx     Past Surgical History:  Procedure Laterality Date   ABDOMINAL HYSTERECTOMY     BILATERAL SALPINGECTOMY Bilateral 07/24/2015   Procedure: BILATERAL SALPINGECTOMY;  Surgeon: Lenord Radon, MD;  Location: [facility];  Service: Gynecology;  Laterality: Bilateral;   CARPAL TUNNEL RELEASE Right 08/19/2023   Procedure: CARPAL TUNNEL RELEASE;  Surgeon: Wes Hamman, MD;  Location: [facility];  Service: Orthopedics;  Laterality: Right;   CHOLECYSTECTOMY     DILATION AND CURETTAGE OF UTERUS     x 2   hyseroscopy     HYSTEROSCOPY  October 27, 2011   VAGINAL HYSTERECTOMY N/A 07/24/2015   Procedure: HYSTERECTOMY VAGINAL;  Surgeon: Lenord Radon, MD;  Location: [facility];  Service: Gynecology;  Laterality: N/A;   Social History   Occupational History   Not  on file  Tobacco Use   Smoking status: Never   Smokeless tobacco: Never  Vaping Use   Vaping status: Never Used  Substance and Sexual Activity   Alcohol use: Yes    Comment: socially   Drug use: No   Sexual activity: Yes    Birth control/protection: Surgical

## 2023-09-15 ENCOUNTER — Encounter: Payer: Self-pay | Admitting: Physician Assistant

## 2023-09-17 ENCOUNTER — Encounter: Payer: Self-pay | Admitting: Physician Assistant

## 2023-09-17 ENCOUNTER — Ambulatory Visit: Payer: Self-pay | Admitting: Physician Assistant

## 2023-09-17 DIAGNOSIS — Z9889 Other specified postprocedural states: Secondary | ICD-10-CM

## 2023-09-17 NOTE — Progress Notes (Signed)
 Office Visit Note   Patient: Diana Lewis           Date of Birth: 03-16-1966           MRN: [id number] Visit Date: 09/17/2023              Requested by: Collins Dean, NP [address] [place],  [place] [address] PCP: Collins Dean, NP  No chief complaint on file.     HPI: Patient is approximately 1 month status post right carpal tunnel release with Dr. Christiane Cowing.  She is concerned because she still has swelling in her wrist.  Denies any fever or chills.  Has not really been doing any activity with her hand or wrist  Assessment & Plan: Visit Diagnoses: Status post carpal tunnel release  Plan: I see no sign of infection.  She is very sensitive even to light touch.  I have told her to continue to wash her hand with antibacterial soap and water she could apply a little softening agent such as cocoa butter to the scar.  I have given her some hand exercises to do.  She has a follow-up with Loris Ros.  Of course if she gets any redness or signs of infection she should call right away  Follow-Up Instructions: No follow-ups on file.   Ortho Exam  Patient is alert, oriented, no adenopathy, well-dressed, normal affect, normal respiratory effort. Examination of her wrist she has a well-healed surgical incision no evidence of dehiscence.  She has no redness no fluctuance.  She is extremely sensitive to touch even light touch.  She has good capillary refill in her fingers and her fingers are warm and pink.  She is able to activate all the intrinsic muscles of her hand  Imaging: No results found. No images are attached to the encounter.  Labs: Lab Results  Component Value Date   HGBA1C 5.5 02/22/2019   REPTSTATUS 06/25/2016 FINAL 06/23/2016   CULT MULTIPLE SPECIES PRESENT, SUGGEST RECOLLECTION (A) 06/23/2016     Lab Results  Component Value Date   ALBUMIN 4.0 08/17/2023   ALBUMIN 4.7 04/24/2023   ALBUMIN 4.7 02/22/2019    No results found for: "MG" Lab  Results  Component Value Date   VD25OH 13.6 (L) 02/22/2019    No results found for: "PREALBUMIN"    Latest Ref Rng & Units 04/24/2023    2:27 PM 02/22/2019   10:05 AM 04/24/2018   11:33 AM  CBC EXTENDED  WBC 3.4 - 10.8 x10E3/uL 8.7  8.1  18.4   RBC 3.77 - 5.28 x10E6/uL 4.85  4.38  4.59   Hemoglobin 11.1 - 15.9 g/dL 19.1  47.8  29.5   HCT 34.0 - 46.6 % 43.7  39.2  42.3   Platelets 150 - 450 x10E3/uL 310  252  249   NEUT# 1.4 - 7.0 x10E3/uL 4.7   16.3   Lymph# 0.7 - 3.1 x10E3/uL 3.2   1.1      There is no height or weight on file to calculate BMI.  Orders:  No orders of the defined types were placed in this encounter.  No orders of the defined types were placed in this encounter.    Procedures: No procedures performed  Clinical Data: No additional findings.  ROS:  All other systems negative, except as noted in the HPI. Review of Systems  Objective: Vital Signs: LMP 07/08/2015 (Exact Date)   Specialty Comments:  No specialty comments available.  PMFS History: Patient Active  Problem List   Diagnosis Date Noted   Right carpal tunnel syndrome 06/09/2023   Left carpal tunnel syndrome 06/09/2023   Post-operative state 07/24/2015   Menometrorrhagia 07/11/2011   Fibroids 07/11/2011   Past Medical History:  Diagnosis Date   Anemia    Anxiety    Depression    Fibroids    GERD (gastroesophageal reflux disease)    Hand pain    Hepatitis    unknown type 27 years ago   Hypercholesterolemia    diet controlled cholesterol   Metrorrhagia    Muscle pain     Family History  Problem Relation Age of Onset   Hyperlipidemia Mother    Hypertension Father    Healthy Sister    Healthy Daughter    Healthy Brother    Healthy Brother    Anesthesia problems Neg Hx    Breast cancer Neg Hx     Past Surgical History:  Procedure Laterality Date   ABDOMINAL HYSTERECTOMY     BILATERAL SALPINGECTOMY Bilateral 07/24/2015   Procedure: BILATERAL SALPINGECTOMY;  Surgeon: Lenord Radon, MD;  Location: [facility];  Service: Gynecology;  Laterality: Bilateral;   CARPAL TUNNEL RELEASE Right 08/19/2023   Procedure: CARPAL TUNNEL RELEASE;  Surgeon: Wes Hamman, MD;  Location: [facility];  Service: Orthopedics;  Laterality: Right;   CHOLECYSTECTOMY     DILATION AND CURETTAGE OF UTERUS     x 2   hyseroscopy     HYSTEROSCOPY  October 27, 2011   VAGINAL HYSTERECTOMY N/A 07/24/2015   Procedure: HYSTERECTOMY VAGINAL;  Surgeon: Lenord Radon, MD;  Location: [facility];  Service: Gynecology;  Laterality: N/A;   Social History   Occupational History   Not on file  Tobacco Use   Smoking status: Never   Smokeless tobacco: Never  Vaping Use   Vaping status: Never Used  Substance and Sexual Activity   Alcohol use: Yes    Comment: socially   Drug use: No   Sexual activity: Yes    Birth control/protection: Surgical

## 2023-09-29 ENCOUNTER — Ambulatory Visit (INDEPENDENT_AMBULATORY_CARE_PROVIDER_SITE_OTHER): Payer: Self-pay | Admitting: Physician Assistant

## 2023-09-29 DIAGNOSIS — G5601 Carpal tunnel syndrome, right upper limb: Secondary | ICD-10-CM

## 2023-09-29 DIAGNOSIS — Z9889 Other specified postprocedural states: Secondary | ICD-10-CM

## 2023-09-29 NOTE — Progress Notes (Signed)
 Post-Op Visit Note   Patient: Diana Lewis           Date of Birth: 1965/12/24           MRN: [id number] Visit Date: 09/29/2023 PCP: Collins Dean, NP   Assessment & Plan:  Chief Complaint:  Chief Complaint  Patient presents with   Other    Right CTR 08/19/23   Visit Diagnoses:  1. Right carpal tunnel syndrome   2. S/P carpal tunnel release     Plan: Patient is a pleasant 58 year old Spanish-speaking female who comes in today with interpreter.  She is possibly 6 weeks status post right carpal tunnel release 08/19/2023.  This was very severe compression of the median nerve.  She still complaining of tenderness over the incision.  She has been applying lotion very lightly.  She complains of numbness to the thumb hands index, long and ring fingertips.  Examination of the right hand reveals a fully healed surgical scar without complication.  She is very tender over the incision.  Fingers are warm well-perfused.  She does have decreased sensation throughout the median nerve distribution.  At this point, recommended scar desensitization.  I have offered physical therapy but she has a home exercise program which has been helping and she would like to continue this.  She will follow-up with us  as needed.  Call with concerns or questions.  Follow-Up Instructions: Return if symptoms worsen or fail to improve.   Orders:  No orders of the defined types were placed in this encounter.  No orders of the defined types were placed in this encounter.   Imaging: No results found.  PMFS History: Patient Active Problem List   Diagnosis Date Noted   Right carpal tunnel syndrome 06/09/2023   Left carpal tunnel syndrome 06/09/2023   Post-operative state 07/24/2015   Menometrorrhagia 07/11/2011   Fibroids 07/11/2011   Past Medical History:  Diagnosis Date   Anemia    Anxiety    Depression    Fibroids    GERD (gastroesophageal reflux disease)    Hand pain    Hepatitis     unknown type 27 years ago   Hypercholesterolemia    diet controlled cholesterol   Metrorrhagia    Muscle pain     Family History  Problem Relation Age of Onset   Hyperlipidemia Mother    Hypertension Father    Healthy Sister    Healthy Daughter    Healthy Brother    Healthy Brother    Anesthesia problems Neg Hx    Breast cancer Neg Hx     Past Surgical History:  Procedure Laterality Date   ABDOMINAL HYSTERECTOMY     BILATERAL SALPINGECTOMY Bilateral 07/24/2015   Procedure: BILATERAL SALPINGECTOMY;  Surgeon: Lenord Radon, MD;  Location: [facility];  Service: Gynecology;  Laterality: Bilateral;   CARPAL TUNNEL RELEASE Right 08/19/2023   Procedure: CARPAL TUNNEL RELEASE;  Surgeon: Wes Hamman, MD;  Location: [facility];  Service: Orthopedics;  Laterality: Right;   CHOLECYSTECTOMY     DILATION AND CURETTAGE OF UTERUS     x 2   hyseroscopy     HYSTEROSCOPY  October 27, 2011   VAGINAL HYSTERECTOMY N/A 07/24/2015   Procedure: HYSTERECTOMY VAGINAL;  Surgeon: Lenord Radon, MD;  Location: [facility];  Service: Gynecology;  Laterality: N/A;   Social History   Occupational History   Not on file  Tobacco Use   Smoking status: Never   Smokeless tobacco: Never  Vaping  Use   Vaping status: Never Used  Substance and Sexual Activity   Alcohol use: Yes    Comment: socially   Drug use: No   Sexual activity: Yes    Birth control/protection: Surgical

## 2023-11-03 ENCOUNTER — Telehealth: Payer: Self-pay

## 2023-11-03 NOTE — Telephone Encounter (Signed)
 Patient Called (Spanish Speaking). Would like to schedule L CTR ASAP. S/P- R CTR 08/19/23.  Does she need to come in for a OV or okay just to schedule surgery? Would like [facility] on a Friday. FYI [employer] [employer] will expire 11/23/2023.   CB: [phone]

## 2023-11-03 NOTE — Telephone Encounter (Signed)
 She does not need to come in.  If debbie can find a 7:30 spot [facility] on a Friday, that's fine with me.

## 2023-11-11 ENCOUNTER — Ambulatory Visit: Payer: Self-pay | Admitting: Dermatology

## 2023-11-11 ENCOUNTER — Encounter: Payer: Self-pay | Admitting: Dermatology

## 2023-11-11 VITALS — BP 118/84

## 2023-11-11 DIAGNOSIS — D485 Neoplasm of uncertain behavior of skin: Secondary | ICD-10-CM

## 2023-11-11 DIAGNOSIS — D2371 Other benign neoplasm of skin of right lower limb, including hip: Secondary | ICD-10-CM

## 2023-11-11 NOTE — Patient Instructions (Addendum)

## 2023-11-11 NOTE — Progress Notes (Signed)
   New Patient Visit   Subjective  Diana Lewis is a 58 y.o. female, accompanied by interpreter, who presents for a NEW PATIENT appointment to be examined for the concerns as listed below.  Spot Check: Located at the R foot that presented 10 years ago. The area is only bothersome when rubbed against closed toe shoes. She has seen her PCP in regard who burned it off a year ago but it has grown back.    Patient denied Hx of Bx. Patient denied family Hx of skin cancer.   The following portions of the chart were reviewed this encounter and updated as appropriate: medications, allergies, medical history  Review of Systems:  No other skin or systemic complaints except as noted in HPI or Assessment and Plan.  Objective  Well appearing patient in no apparent distress; mood and affect are within normal limits.   A focused examination was performed of the following areas: R foot   Relevant exam findings are noted in the Assessment and Plan.        Assessment & Plan     NEOPLASM OF UNCERTAIN BEHAVIOR OF SKIN Right Foot - Anterior Skin / nail biopsy Type of biopsy: tangential   Informed consent: discussed and consent obtained   Timeout: patient name, date of birth, surgical site, and procedure verified   Procedure prep:  Patient was prepped and draped in usual sterile fashion Prep type:  Isopropyl alcohol Anesthesia: the lesion was anesthetized in a standard fashion   Anesthetic:  1% lidocaine  w/ epinephrine  1-100,000 buffered w/ 8.4% NaHCO3 Instrument used: DermaBlade   Hemostasis achieved with: aluminum chloride   Outcome: patient tolerated procedure well   Post-procedure details: sterile dressing applied and wound care instructions given   Dressing type: petrolatum gauze and bandage    Specimen 1 - Surgical pathology Differential Diagnosis: r/o ISK v SCC v verruca  Check Margins: yes  No follow-ups on file.   Documentation: I have reviewed the above  documentation for accuracy and completeness, and I agree with the above.  I, Shirron Maranda, CMA, am acting as scribe for [employer], DO.   Delon Lenis, DO

## 2023-11-12 LAB — SURGICAL PATHOLOGY

## 2023-11-16 ENCOUNTER — Ambulatory Visit: Admitting: Dermatology

## 2023-11-17 ENCOUNTER — Ambulatory Visit: Payer: Self-pay | Admitting: Dermatology

## 2024-02-22 ENCOUNTER — Encounter: Payer: Self-pay | Admitting: Radiology

## 2024-05-06 ENCOUNTER — Ambulatory Visit: Payer: Self-pay

## 2024-05-06 NOTE — Telephone Encounter (Signed)
 Noted. Patient has appointment on 05/18/24 to discuss concerns.

## 2024-05-06 NOTE — Telephone Encounter (Signed)
 FYI Only or Action Required?: FYI only for provider: ED advised.  Patient was last seen in primary care on 04/24/2023 by Theotis Haze ORN, NP.  Called Nurse Triage reporting Fatigue and Generalized Body Aches.  Symptoms began several months ago.  Interventions attempted: Rest, hydration, or home remedies.  Symptoms are: gradually worsening.  Triage Disposition: Go to ED Now (Notify PCP)  Patient/caregiver understands and will follow disposition?: Unsure  Message from Avram MATSU sent at 05/06/2024 10:07 AM EST  Reason for Triage: always feeling tired, body pain   Reason for Disposition  [1] Chest pain (or angina) comes and goes AND [2] is happening more often (increasing in frequency) or getting worse (increasing in severity)  (Exception: Chest pains that last only a few seconds.)  Answer Assessment - Initial Assessment Questions For the last couple months patient has been having difficulty falling asleep and then staying asleep- used to happen rarely and now is most days a week. Has history of anxiety but has not been treated for in awhile. Feels like she stays up thinking. Exhausted when she wakes even if she sleeps through the night.   Body Aches- for a long time, legs, arms, body- 7-8/10 pain. Reports feeling lumps behind her ears.   Doesn't feel like going to work. Always been active- walk a lot at work has to go to other locations and she just feels heavy like she could fall asleep.  Denies SOB but feels like has to remind herself take a deep breath sometimes.   Sometimes feels stabbing sensation left chest-. Not all the time but happening for the last couple days. Happening while she is working-  neck feels pain bc tiredness, denies numb/tingling. Feels like a bug in her chest - comes on for a few seconds and then goes away.   Tylenol - was on it for a bit but stopped and has now restarted.  Reports no injury or swelling causing body aches/pains-   lost taste or smell-  noticed after the pandemic- has been 2-3 years.   Advised ED for Chest pain - body aches and fatigue- appt 1/28 and waitlist.    1. DESCRIPTION: Describe how you are feeling.     Tired all the time. Not sleeping well at night- was only occasionally and now it is all the time 2. SEVERITY: How bad is it?  Can you stand and walk?     Can stand  3. ONSET: When did these symptoms begin? (e.g., hours, days, weeks, months)     Awhile  4. CAUSE: What do you think is causing the weakness or fatigue? (e.g., not drinking enough fluids, medical problem, trouble sleeping)     Not sleeping  5. NEW MEDICINES:  Have you started on any new medicines recently? (e.g., opioid pain medicines, benzodiazepines, muscle relaxants, antidepressants, antihistamines, neuroleptics, beta blockers)     Gabapentin   6. OTHER SYMPTOMS: Do you have any other symptoms? (e.g., chest pain, fever, cough, SOB, vomiting, diarrhea, bleeding, other areas of pain)     Body aches  Answer Assessment - Initial Assessment Questions 1. LOCATION: Where does it hurt?       Left chest 2. RADIATION: Does the pain go anywhere else? (e.g., into neck, jaw, arms, back)     Neck but says that's from fatigue 3. ONSET: When did the chest pain begin? (Minutes, hours or days)      A few days 4. PATTERN: Does the pain come and go, or has it been constant since  it started?  Does it get worse with exertion?      Comes and goes 5. DURATION: How long does it last (e.g., seconds, minutes, hours)     A few seconds  6. SEVERITY: How bad is the pain?  (e.g., Scale 1-10; mild, moderate, or severe)     Mild- Feels like a bug in her chest but also described stabbing sensation 7. CARDIAC RISK FACTORS: Do you have any history of heart problems or risk factors for heart disease? (e.g., angina, prior heart attack; diabetes, high blood pressure, high cholesterol, smoker, or strong family history of heart disease)     denies 8.  PULMONARY RISK FACTORS: Do you have any history of lung disease?  (e.g., blood clots in lung, asthma, emphysema, birth control pills)     denies 9. CAUSE: What do you think is causing the chest pain?     unsure 10. OTHER SYMPTOMS: Do you have any other symptoms? (e.g., dizziness, nausea, vomiting, sweating, fever, difficulty breathing, cough)       Neck pain but states from fatigue  Protocols used: Weakness (Generalized) and Fatigue-A-AH, Chest Pain-A-AH

## 2024-05-17 ENCOUNTER — Telehealth: Payer: Self-pay | Admitting: Nurse Practitioner

## 2024-05-17 NOTE — Telephone Encounter (Signed)
 Pt confirmed appt

## 2024-05-18 ENCOUNTER — Ambulatory Visit: Payer: Self-pay | Attending: *Deleted | Admitting: *Deleted

## 2024-05-18 ENCOUNTER — Encounter: Payer: Self-pay | Admitting: *Deleted

## 2024-05-18 VITALS — BP 123/74 | HR 79 | Ht 60.0 in | Wt 145.0 lb

## 2024-05-18 DIAGNOSIS — F32A Depression, unspecified: Secondary | ICD-10-CM

## 2024-05-18 DIAGNOSIS — R5383 Other fatigue: Secondary | ICD-10-CM

## 2024-05-18 MED ORDER — SERTRALINE HCL 25 MG PO TABS: 25.0000 mg | ORAL_TABLET | Freq: Every day | ORAL | 3 refills | Status: AC

## 2024-05-18 NOTE — Progress Notes (Signed)
 "   Patient ID: Diana Lewis, female    DOB: Aug 29, 1965  MRN: [id number]  CC: Insomnia (Unable to sleep - intermittently sleeping 3-4 hrs a night /Experiencing increased anxiety - intermittent chest pain due to anxiety //)   Subjective: Diana Lewis is a 59 y.o. female who presents for evaluation of fatigue and bodyaches that has been going on for several months associated with intermittent chest sensation (she denies pain) that she describes as pinching.  Also reports difficulty falling and staying asleep.  She does have history of anxiety that has been treated in the past with diazepam. Her chest sensation is not exertional, not associated with sweats or nausea. She denies fever or chills.  No rash. She reports feeling more depressed and anxious.   No history of anemia or thyroid disorder, no history of diabetes or sleep apnea.  No history of CFS/ME.  Pertinent past medical history include: History of anxiety in the past, bilateral carpal tunnel syndrome, history of elevated LFTs, history of leukocytosis, unspecified type    Patient Active Problem List   Diagnosis Date Noted   Right carpal tunnel syndrome 06/09/2023   Left carpal tunnel syndrome 06/09/2023   Post-operative state 07/24/2015   Menometrorrhagia 07/11/2011   Fibroids 07/11/2011     Medications Ordered Prior to Encounter[1]  Allergies[2]  Social History   Socioeconomic History   Marital status: Single    Spouse name: Not on file   Number of children: 1   Years of education: Not on file   Highest education level: 8th grade  Occupational History   Not on file  Tobacco Use   Smoking status: Never   Smokeless tobacco: Never  Vaping Use   Vaping status: Never Used  Substance and Sexual Activity   Alcohol use: Yes    Comment: socially   Drug use: No   Sexual activity: Yes    Birth control/protection: Surgical  Other Topics Concern   Not on file  Social History Narrative   Patient's  father passed away on 12/24/19 due to COVID-19.   Social Drivers of Health   Tobacco Use: Low Risk (11/11/2023)   Patient History    Smoking Tobacco Use: Never    Smokeless Tobacco Use: Never    Passive Exposure: Not on file  Financial Resource Strain: Not on file  Food Insecurity: Not on file  Transportation Needs: Not on file  Physical Activity: Not on file  Stress: Not on file  Social Connections: Not on file  Intimate Partner Violence: Not on file  Depression (PHQ2-9): Medium Risk (05/18/2024)   Depression (PHQ2-9)    PHQ-2 Score: 6  Alcohol Screen: Not on file  Housing: Not on file  Utilities: Not on file  Health Literacy: Not on file    Family History  Problem Relation Age of Onset   Hyperlipidemia Mother    Hypertension Father    Healthy Sister    Healthy Daughter    Healthy Brother    Healthy Brother    Anesthesia problems Neg Hx    Breast cancer Neg Hx     Past Surgical History:  Procedure Laterality Date   ABDOMINAL HYSTERECTOMY     BILATERAL SALPINGECTOMY Bilateral 07/24/2015   Procedure: BILATERAL SALPINGECTOMY;  Surgeon: Elveria Mungo, MD;  Location: [facility];  Service: Gynecology;  Laterality: Bilateral;   CARPAL TUNNEL RELEASE Right 08/19/2023   Procedure: CARPAL TUNNEL RELEASE;  Surgeon: Jerri Kay HERO, MD;  Location: [facility];  Service: Orthopedics;  Laterality:  Right;   CHOLECYSTECTOMY     DILATION AND CURETTAGE OF UTERUS     x 2   hyseroscopy     HYSTEROSCOPY  October 27, 2011   VAGINAL HYSTERECTOMY N/A 07/24/2015   Procedure: HYSTERECTOMY VAGINAL;  Surgeon: Elveria Mungo, MD;  Location: [facility];  Service: Gynecology;  Laterality: N/A;    ROS: Review of Systems Negative except as stated above  PHYSICAL EXAM: BP 123/74 (BP Location: Left Arm, Patient Position: Sitting, Cuff Size: Normal)   Pulse 79   Ht 5' (1.524 m)   Wt 65.8 kg   LMP 07/08/2015   SpO2 99%   BMI 28.32 kg/m   Physical Exam Vitals and nursing  note reviewed.  Constitutional:      Appearance: Normal appearance.  HENT:     Mouth/Throat:     Mouth: Mucous membranes are moist.     Pharynx: Oropharynx is clear.  Eyes:     Conjunctiva/sclera: Conjunctivae normal.  Cardiovascular:     Rate and Rhythm: Normal rate and regular rhythm.  Pulmonary:     Effort: Pulmonary effort is normal.     Breath sounds: Normal breath sounds.  Abdominal:     General: Abdomen is flat.     Palpations: Abdomen is soft.  Skin:    General: Skin is warm and dry.  Neurological:     Mental Status: Mental status is at baseline.  Psychiatric:        Mood and Affect: Mood normal.        Behavior: Behavior normal.        Thought Content: Thought content normal.        Judgment: Judgment normal.     Comments: She completed a PHQ-9 and GAD-7 during this clinic visit. She denies suicidal or homicidal ideation.  No audiovisual hallucinations          Latest Ref Rng & Units 08/17/2023   11:00 AM 04/24/2023    2:27 PM 02/22/2019   10:05 AM  CMP  Glucose 70 - 99 mg/dL 831  94  896   BUN 6 - 20 mg/dL 14  14  16    Creatinine 0.44 - 1.00 mg/dL 9.37  9.42  9.28   Sodium 135 - 145 mmol/L 137  138  140   Potassium 3.5 - 5.1 mmol/L 3.8  4.5  4.2   Chloride 98 - 111 mmol/L 104  99  100   CO2 22 - 32 mmol/L 22  21  22    Calcium  8.9 - 10.3 mg/dL 9.7  89.9  9.7   Total Protein 6.5 - 8.1 g/dL 7.4  7.7  7.3   Total Bilirubin 0.0 - 1.2 mg/dL 0.6  <9.7  0.3   Alkaline Phos 38 - 126 U/L 88  121  86   AST 15 - 41 U/L 53  32  36   ALT 0 - 44 U/L 79  50  66    Lipid Panel     Component Value Date/Time   CHOL 138 03/29/2019 0847   TRIG 700 (HH) 03/29/2019 0847   HDL 33 (L) 03/29/2019 0847   CHOLHDL 4.2 03/29/2019 0847   LDLCALC 16 03/29/2019 0847    CBC    Component Value Date/Time   WBC 8.7 04/24/2023 1427   WBC 18.4 (H) 04/24/2018 1133   RBC 4.85 04/24/2023 1427   RBC 4.59 04/24/2018 1133   HGB 14.7 04/24/2023 1427   HCT 43.7 04/24/2023 1427   PLT  310 04/24/2023 1427  MCV 90 04/24/2023 1427   MCH 30.3 04/24/2023 1427   MCH 31.2 04/24/2018 1133   MCHC 33.6 04/24/2023 1427   MCHC 33.8 04/24/2018 1133   RDW 12.5 04/24/2023 1427   LYMPHSABS 3.2 (H) 04/24/2023 1427   MONOABS 0.8 04/24/2018 1133   EOSABS 0.1 04/24/2023 1427   BASOSABS 0.0 04/24/2023 1427    Results for orders placed or performed in visit on 11/11/23  Surgical pathology   Collection Time: 11/11/23 12:00 AM  Result Value Ref Range   SURGICAL PATHOLOGY      SURGICAL PATHOLOGY [facility] [address] [place], [place] [address] Telephone [phone] or [phone] Fax [phone]  REPORT OF DERMATOPATHOLOGY   Accession #: [id number] Patient Name: LEVAEH, VICE Visit # : [id number]  MRN: [id number] Cytotechnologist: Ephriam Rolla Edelman, Dermatopathologist, Electronic Signature DOB/Age [birth year] (Age: 83) Gender: F Collected Date: 11/11/2023 Received Date: 11/11/2023  FINAL DIAGNOSIS       1. 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 There is a proliferation of uniform polygonal cells arising from the epidermis without atypia.  Duct formation is focally identified.   The lesion extends to the peripheral and deep margins of the specimen.  C ASE COMMENTS STAINS USED IN DIAGNOSIS: H&E    CLINICAL HISTORY  SPECIMEN(S) OBTAINED 1. Skin, Right Foot - Anterior  SPECIMEN COMMENTS: 1. Check margins SPECIMEN CLINICAL INFORMATION: 1. Neoplasm of uncertain behavior of skin, R/O ISK vs SCC vs verruca    Gross Description 1. Formalin fixed specimen received:  10 X 8 X 3 MM, TOTO (4 P) (1 B) ( QB ) margins inked.        Report signed out from the following  location(s) [place] [address] [place], [place] [address] CLIA #: [id number]  [facility] [address] [place], [place] [address] CLIA #: [id number]      ASSESSMENT AND PLAN:  Assessment & Plan Fatigue due to depression As above patient has had fatigue and body ache for several months with a history of anxiety/depression in the past. She wakes up tired and does not feel like going to work. Has trouble falling asleep and staying asleep. Will rule out anemia, thyroid disorder, diabetes with baseline lab work. She is agreeable to starting low-dose SSRI and going to therapy. She will call if her symptoms persist or worsen. She is encouraged to see her PCP in 1 month Orders:   CBC with Differential   Comprehensive metabolic panel with GFR   TSH  Depression, unspecified depression type As above she is agreeable to starting low-dose Zoloft  and talking to a therapist Orders placed Orders:   sertraline  (ZOLOFT ) 25 MG tablet; Take 1 tablet (25 mg total) by mouth daily.   Ambulatory referral to Psychology       Patient was given the opportunity to ask questions.  Patient verbalized understanding of the plan and was able to repeat key elements of the plan.   This documentation was completed using Paediatric nurse.  Any transcriptional errors are unintentional.     Requested Prescriptions    No prescriptions requested or ordered in this encounter    Return in about 4 weeks (around 06/15/2024) for with PCP.  Dontel Harshberger H,  NP     [1]  Current Outpatient Medications on File Prior to Visit  Medication Sig Dispense Refill   ibuprofen  (ADVIL ) 800 MG tablet Take 1 tablet (800 mg total) by mouth every 8 (eight) hours as needed. 30 tablet 1   diazepam (VALIUM) 2 MG tablet Take 2 mg by mouth daily as needed for anxiety. (Patient not taking: Reported on 05/18/2024)     gabapentin  (NEURONTIN ) 300 MG capsule Take 1 capsule  (300 mg total) by mouth 3 (three) times daily. (Patient not taking: Reported on 05/18/2024) 90 capsule 3   HYDROcodone -acetaminophen  (NORCO/VICODIN) 5-325 MG tablet Take 1 tablet by mouth 3 (three) times daily as needed for moderate pain (pain score 4-6). To be taken after surgery (Patient not taking: Reported on 05/18/2024) 21 tablet 0   ondansetron  (ZOFRAN ) 4 MG tablet Take 1 tablet (4 mg total) by mouth every 8 (eight) hours as needed for nausea or vomiting. (Patient not taking: Reported on 05/18/2024) 40 tablet 0   traMADol  (ULTRAM ) 50 MG tablet Take 1 tablet (50 mg total) by mouth 2 (two) times daily as needed. (Patient not taking: Reported on 05/18/2024) 30 tablet 1   No current facility-administered medications on file prior to visit.  [2] No Known Allergies  "

## 2024-05-18 NOTE — Patient Instructions (Signed)
 Today we discussed your fatigue and bodyaches that have been going on for several months associated with difficulty falling and staying asleep. You do have some fleeting chest sensation that is not associated with exertion, sweats or nausea so I do not think that it your heart. You have a history of anxiety that has been treated in the past with diazepam. We do not usually use that medication long-term because of the side effects and potential for addiction you completed a PHQ-9 and GAD-7 which rate your depression and anxiety.  You are assured me that you are not suicidal, homicidal or if seeing or hearing things that were not there. We agreed to check your lab work, start some low-dose Zoloft  and get you into therapy We will notify results of lab work when it is available Call if you have any questions or concerns. Keep your appointment with your primary care provider in 1 month

## 2024-05-19 ENCOUNTER — Ambulatory Visit: Payer: Self-pay | Admitting: *Deleted

## 2024-05-19 DIAGNOSIS — R7989 Other specified abnormal findings of blood chemistry: Secondary | ICD-10-CM

## 2024-05-19 LAB — CBC WITH DIFFERENTIAL/PLATELET
Basophils Absolute: 0 10*3/uL (ref 0.0–0.2)
Basos: 0 %
EOS (ABSOLUTE): 0.2 10*3/uL (ref 0.0–0.4)
Eos: 2 %
Hematocrit: 42.9 % (ref 34.0–46.6)
Hemoglobin: 14.2 g/dL (ref 11.1–15.9)
Immature Grans (Abs): 0 10*3/uL (ref 0.0–0.1)
Immature Granulocytes: 0 %
Lymphocytes Absolute: 3 10*3/uL (ref 0.7–3.1)
Lymphs: 38 %
MCH: 30.7 pg (ref 26.6–33.0)
MCHC: 33.1 g/dL (ref 31.5–35.7)
MCV: 93 fL (ref 79–97)
Monocytes Absolute: 0.5 10*3/uL (ref 0.1–0.9)
Monocytes: 7 %
Neutrophils Absolute: 4.1 10*3/uL (ref 1.4–7.0)
Neutrophils: 53 %
Platelets: 276 10*3/uL (ref 150–450)
RBC: 4.62 x10E6/uL (ref 3.77–5.28)
RDW: 12.7 % (ref 11.7–15.4)
WBC: 7.8 10*3/uL (ref 3.4–10.8)

## 2024-05-19 LAB — COMPREHENSIVE METABOLIC PANEL WITH GFR
ALT: 58 [IU]/L — ABNORMAL HIGH (ref 0–32)
AST: 43 [IU]/L — ABNORMAL HIGH (ref 0–40)
Albumin: 4.4 g/dL (ref 3.8–4.9)
Alkaline Phosphatase: 122 [IU]/L (ref 49–135)
BUN/Creatinine Ratio: 25 — ABNORMAL HIGH (ref 9–23)
BUN: 15 mg/dL (ref 6–24)
Bilirubin Total: 0.3 mg/dL (ref 0.0–1.2)
CO2: 23 mmol/L (ref 20–29)
Calcium: 9.5 mg/dL (ref 8.7–10.2)
Chloride: 102 mmol/L (ref 96–106)
Creatinine, Ser: 0.61 mg/dL (ref 0.57–1.00)
Globulin, Total: 2.7 g/dL (ref 1.5–4.5)
Glucose: 95 mg/dL (ref 70–99)
Potassium: 4.4 mmol/L (ref 3.5–5.2)
Sodium: 139 mmol/L (ref 134–144)
Total Protein: 7.1 g/dL (ref 6.0–8.5)
eGFR: 105 mL/min/{1.73_m2}

## 2024-05-19 LAB — TSH: TSH: 1.67 u[IU]/mL (ref 0.450–4.500)

## 2024-06-15 ENCOUNTER — Ambulatory Visit: Payer: Self-pay | Admitting: Nurse Practitioner
